# Patient Record
Sex: Female | Born: 1985 | Race: White | Hispanic: No | Marital: Married | Smoking: Never smoker
Health system: Southern US, Community
[De-identification: ages and names within clinical notes are randomized; demographics above are authoritative.]

## PROBLEM LIST (undated history)

## (undated) DIAGNOSIS — F32A Depression, unspecified: Secondary | ICD-10-CM

## (undated) DIAGNOSIS — R011 Cardiac murmur, unspecified: Secondary | ICD-10-CM

## (undated) DIAGNOSIS — D649 Anemia, unspecified: Secondary | ICD-10-CM

## (undated) DIAGNOSIS — J45909 Unspecified asthma, uncomplicated: Secondary | ICD-10-CM

## (undated) DIAGNOSIS — J069 Acute upper respiratory infection, unspecified: Secondary | ICD-10-CM

## (undated) DIAGNOSIS — F419 Anxiety disorder, unspecified: Secondary | ICD-10-CM

## (undated) DIAGNOSIS — G4730 Sleep apnea, unspecified: Secondary | ICD-10-CM

## (undated) HISTORY — DX: Cardiac murmur, unspecified: R01.1

## (undated) HISTORY — DX: Anemia, unspecified: D64.9

## (undated) HISTORY — PX: COLONOSCOPY: SHX174

## (undated) HISTORY — DX: Anxiety disorder, unspecified: F41.9

## (undated) HISTORY — DX: Unspecified asthma, uncomplicated: J45.909

## (undated) HISTORY — DX: Depression, unspecified: F32.A

## (undated) HISTORY — DX: Acute upper respiratory infection, unspecified: J06.9

---

## 2020-07-31 ENCOUNTER — Other Ambulatory Visit (HOSPITAL_COMMUNITY): Payer: Self-pay | Admitting: Internal Medicine

## 2020-10-19 ENCOUNTER — Other Ambulatory Visit: Payer: Self-pay

## 2020-10-19 ENCOUNTER — Encounter: Payer: Self-pay | Admitting: Emergency Medicine

## 2020-10-19 ENCOUNTER — Ambulatory Visit
Admission: EM | Admit: 2020-10-19 | Discharge: 2020-10-19 | Disposition: A | Discharge status: Home / Self Care | Payer: 59 | Attending: Emergency Medicine | Admitting: Emergency Medicine

## 2020-10-19 DIAGNOSIS — R350 Frequency of micturition: Secondary | ICD-10-CM

## 2020-10-19 DIAGNOSIS — R300 Dysuria: Secondary | ICD-10-CM

## 2020-10-19 LAB — POCT URINALYSIS DIP (MANUAL ENTRY)
Bilirubin, UA: NEGATIVE
Glucose, UA: NEGATIVE mg/dL
Ketones, POC UA: NEGATIVE mg/dL
Nitrite, UA: NEGATIVE
Protein Ur, POC: NEGATIVE mg/dL
Spec Grav, UA: 1.01 (ref 1.010–1.025)
Urobilinogen, UA: 0.2 E.U./dL
pH, UA: 6.5 (ref 5.0–8.0)

## 2020-10-19 MED ORDER — PHENAZOPYRIDINE HCL 200 MG PO TABS: 200.0000 mg | ORAL_TABLET | Freq: Three times a day (TID) | ORAL | 0 refills | Status: DC

## 2020-10-19 MED ORDER — NITROFURANTOIN MONOHYD MACRO 100 MG PO CAPS: 100.0000 mg | ORAL_CAPSULE | Freq: Two times a day (BID) | ORAL | 0 refills | Status: DC

## 2020-10-19 NOTE — ED Provider Notes (Signed)
[facility]   CC: Burning with urination  SUBJECTIVE:  Cindy Hamilton is a 35 y.o. female who complains of urinary frequency, urgency and dysuria for the past 1 day.  Reports recent sexual activity.  Denies abdominal or back pain.  Has NOT tried OTC medications.  Symptoms are made worse with urination.  Denies similar symptoms in the past.  Denies fever, chills, nausea, vomiting, abdominal pain, flank pain, abnormal vaginal discharge or bleeding, hematuria.    LMP: Patient's last menstrual period was 09/21/2020.  ROS: As in HPI.  All other pertinent ROS negative.     History reviewed. No pertinent past medical history. History reviewed. No pertinent surgical history. No Known Allergies No current facility-administered medications on file prior to encounter.   Current Outpatient Medications on File Prior to Encounter  Medication Sig Dispense Refill  . ferrous sulfate 325 (65 FE) MG EC tablet Take 325 mg by mouth 3 (three) times daily with meals.    Marland Kitchen FLUoxetine (PROZAC) 20 MG tablet Take 20 mg by mouth daily.     Social History   Socioeconomic History  . Marital status: Married    Spouse name: Not on file  . Number of children: Not on file  . Years of education: Not on file  . Highest education level: Not on file  Occupational History  . Not on file  Tobacco Use  . Smoking status: Not on file  . Smokeless tobacco: Not on file  Substance and Sexual Activity  . Alcohol use: Not on file  . Drug use: Not on file  . Sexual activity: Not on file  Other Topics Concern  . Not on file  Social History Narrative  . Not on file   Social Determinants of Health   Financial Resource Strain: Not on file  Food Insecurity: Not on file  Transportation Needs: Not on file  Physical Activity: Not on file  Stress: Not on file  Social Connections: Not on file  Intimate Partner Violence: Not on file   History reviewed. No pertinent family history.  OBJECTIVE:  Vitals:    10/19/20 1256  BP: 118/69  Pulse: 81  Resp: 16  Temp: 98.5 F (36.9 C)  TempSrc: Oral  SpO2: 98%  Weight: 245 lb (111.1 kg)   General appearance: Alert in no acute distress HEENT: NCAT.  Oropharynx clear.  Lungs: clear to auscultation bilaterally without adventitious breath sounds Heart: regular rate and rhythm.   Abdomen: soft; non-distended; no tenderness; bowel sounds present; no guarding Back: no CVA tenderness Extremities: moves extremities without difficulty Skin: warm and dry Neurologic: Ambulates from chair to exam table without difficulty Psychological: alert and cooperative; normal mood and affect  Labs Reviewed  POCT URINALYSIS DIP (MANUAL ENTRY) - Abnormal; Notable for the following components:      Result Value   Blood, UA trace-intact (*)    Leukocytes, UA Trace (*)    All other components within normal limits    ASSESSMENT & PLAN:  1. Dysuria   2. Urinary frequency     Meds ordered this encounter  Medications  . nitrofurantoin, macrocrystal-monohydrate, (MACROBID) 100 MG capsule    Sig: Take 1 capsule (100 mg total) by mouth 2 (two) times daily.    Dispense:  10 capsule    Refill:  0    Order Specific Question:   Supervising Provider    Answer:   Eustace Moore [[id number]]  . phenazopyridine (PYRIDIUM) 200 MG tablet    Sig: Take 1  tablet (200 mg total) by mouth 3 (three) times daily.    Dispense:  6 tablet    Refill:  0    Order Specific Question:   Supervising Provider    Answer:   Eustace Moore [[id number]]   Urine concerning for UTI Urine culture sent.  We will call you with abnormal results.   Push fluids and get plenty of rest.   Take antibiotic as directed and to completion Take pyridium as prescribed and as needed for symptomatic relief Follow up with PCP if symptoms persists Return here or go to ER if you have any new or worsening symptoms such as fever, worsening abdominal pain, nausea/vomiting, flank pain, etc...  Outlined  signs and symptoms indicating need for more acute intervention. Patient verbalized understanding. After Visit Summary given.     Rennis Harding, PA-C 10/19/20 1335

## 2020-10-19 NOTE — Discharge Instructions (Signed)
Urine concerning for UTI Urine culture sent.  We will call you with abnormal results.   Push fluids and get plenty of rest.   Take antibiotic as directed and to completion Take pyridium as prescribed and as needed for symptomatic relief Follow up with PCP if symptoms persists Return here or go to ER if you have any new or worsening symptoms such as fever, worsening abdominal pain, nausea/vomiting, flank pain, etc... 

## 2020-10-19 NOTE — ED Triage Notes (Signed)
Pt started having UTI symptoms yesterday.  Urgency and frequency and burning.

## 2020-10-22 LAB — URINE CULTURE: Culture: 40000 — AB

## 2020-11-03 ENCOUNTER — Other Ambulatory Visit: Payer: Self-pay

## 2020-11-03 ENCOUNTER — Encounter: Payer: Self-pay | Admitting: Internal Medicine

## 2020-11-03 ENCOUNTER — Other Ambulatory Visit (HOSPITAL_COMMUNITY): Payer: Self-pay

## 2020-11-03 ENCOUNTER — Other Ambulatory Visit (HOSPITAL_COMMUNITY)
Admission: RE | Admit: 2020-11-03 | Discharge: 2020-11-03 | Disposition: A | Discharge status: Home / Self Care | Payer: 59 | Source: Ambulatory Visit | Attending: Internal Medicine | Admitting: Internal Medicine

## 2020-11-03 ENCOUNTER — Ambulatory Visit (HOSPITAL_COMMUNITY)
Admission: RE | Admit: 2020-11-03 | Discharge: 2020-11-03 | Disposition: A | Discharge status: Home / Self Care | Payer: 59 | Source: Ambulatory Visit | Attending: Internal Medicine | Admitting: Internal Medicine

## 2020-11-03 ENCOUNTER — Other Ambulatory Visit (HOSPITAL_BASED_OUTPATIENT_CLINIC_OR_DEPARTMENT_OTHER): Payer: Self-pay

## 2020-11-03 ENCOUNTER — Telehealth: Payer: 59 | Admitting: Internal Medicine

## 2020-11-03 VITALS — Ht 70.0 in | Wt 250.0 lb

## 2020-11-03 DIAGNOSIS — E669 Obesity, unspecified: Secondary | ICD-10-CM

## 2020-11-03 DIAGNOSIS — Z13220 Encounter for screening for lipoid disorders: Secondary | ICD-10-CM | POA: Diagnosis not present

## 2020-11-03 DIAGNOSIS — M542 Cervicalgia: Secondary | ICD-10-CM | POA: Diagnosis not present

## 2020-11-03 DIAGNOSIS — D649 Anemia, unspecified: Secondary | ICD-10-CM | POA: Insufficient documentation

## 2020-11-03 LAB — CBC WITH DIFFERENTIAL/PLATELET
Abs Immature Granulocytes: 0.02 10*3/uL (ref 0.00–0.07)
Basophils Absolute: 0 10*3/uL (ref 0.0–0.1)
Basophils Relative: 1 %
Eosinophils Absolute: 0.1 10*3/uL (ref 0.0–0.5)
Eosinophils Relative: 2 %
HCT: 38.5 % (ref 36.0–46.0)
Hemoglobin: 12.9 g/dL (ref 12.0–15.0)
Immature Granulocytes: 0 %
Lymphocytes Relative: 28 %
Lymphs Abs: 1.9 10*3/uL (ref 0.7–4.0)
MCH: 29.1 pg (ref 26.0–34.0)
MCHC: 33.5 g/dL (ref 30.0–36.0)
MCV: 86.9 fL (ref 80.0–100.0)
Monocytes Absolute: 0.4 10*3/uL (ref 0.1–1.0)
Monocytes Relative: 7 %
Neutro Abs: 4.2 10*3/uL (ref 1.7–7.7)
Neutrophils Relative %: 62 %
Platelets: 273 10*3/uL (ref 150–400)
RBC: 4.43 MIL/uL (ref 3.87–5.11)
RDW: 13 % (ref 11.5–15.5)
WBC: 6.6 10*3/uL (ref 4.0–10.5)
nRBC: 0 % (ref 0.0–0.2)

## 2020-11-03 LAB — TSH: TSH: 1.2 u[IU]/mL (ref 0.350–4.500)

## 2020-11-03 LAB — COMPREHENSIVE METABOLIC PANEL
ALT: 17 U/L (ref 0–44)
AST: 18 U/L (ref 15–41)
Albumin: 4.2 g/dL (ref 3.5–5.0)
Alkaline Phosphatase: 70 U/L (ref 38–126)
Anion gap: 10 (ref 5–15)
BUN: 11 mg/dL (ref 6–20)
CO2: 24 mmol/L (ref 22–32)
Calcium: 9.6 mg/dL (ref 8.9–10.3)
Chloride: 103 mmol/L (ref 98–111)
Creatinine, Ser: 0.6 mg/dL (ref 0.44–1.00)
GFR, Estimated: >60 mL/min (ref >60)
Glucose, Bld: 122 mg/dL — ABNORMAL HIGH (ref 70–99)
Potassium: 3.6 mmol/L (ref 3.5–5.1)
Sodium: 137 mmol/L (ref 135–145)
Total Bilirubin: 0.4 mg/dL (ref 0.3–1.2)
Total Protein: 7.4 g/dL (ref 6.5–8.1)

## 2020-11-03 LAB — FERRITIN: Ferritin: 61 ng/mL (ref 11–307)

## 2020-11-03 LAB — FOLATE: Folate: 8.8 ng/mL (ref >5.9)

## 2020-11-03 LAB — IRON AND TIBC
Iron: 43 ug/dL (ref 28–170)
Saturation Ratios: 12 % (ref 10.4–31.8)
TIBC: 371 ug/dL (ref 250–450)
UIBC: 328 ug/dL

## 2020-11-03 LAB — VITAMIN B12: Vitamin B-12: 546 pg/mL (ref 180–914)

## 2020-11-03 LAB — HEMOGLOBIN A1C
Hgb A1c MFr Bld: 5.4 % (ref 4.8–5.6)
Mean Plasma Glucose: 108.28 mg/dL

## 2020-11-03 MED ORDER — FERROUS SULFATE 325 (65 FE) MG PO TBEC
325.0000 mg | DELAYED_RELEASE_TABLET | Freq: Every day | ORAL | 2 refills | Status: AC
  Filled 2020-11-03 (×2): qty 90, 90d supply, fill #0

## 2020-11-03 MED ORDER — VALACYCLOVIR HCL 1 G PO TABS
1000.0000 mg | ORAL_TABLET | Freq: Two times a day (BID) | ORAL | 0 refills | Status: DC
  Filled 2020-11-03 (×2): qty 60, 30d supply, fill #0

## 2020-11-03 MED ORDER — DICLOFENAC SODIUM 1 % EX GEL
2.0000 g | Freq: Four times a day (QID) | CUTANEOUS | 1 refills | Status: DC
  Filled 2020-11-03: qty 100, 6d supply, fill #0
  Filled 2020-11-03: qty 100, 15d supply, fill #0

## 2020-11-03 MED ORDER — FLUOXETINE HCL 20 MG PO CAPS
ORAL_CAPSULE | Freq: Every day | ORAL | 6 refills | Status: DC
  Filled 2020-11-03 (×2): qty 30, 30d supply, fill #0
  Filled 2020-12-12: qty 30, 30d supply, fill #1
  Filled 2021-01-17: qty 30, 30d supply, fill #2
  Filled 2021-02-20: qty 30, 30d supply, fill #3
  Filled 2021-04-01: qty 30, 30d supply, fill #4
  Filled 2021-05-08: qty 30, 30d supply, fill #5
  Filled 2021-06-06: qty 30, 30d supply, fill #6

## 2020-11-03 NOTE — Progress Notes (Signed)
Ht 5\' 10"  (1.778 m)   Wt 250 lb (113.4 kg)   BMI 35.87 kg/m    Subjective:    Patient ID: Cindy Hamilton, female    DOB: [birth year], 35 y.o.   MRN: [id number]  HPI: Cindy Hamilton is a 35 y.o. female  Pt Is new to the practice.  Depression        This is a chronic (prozac 20 mg daily for 10 - 12 years ) problem.  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   Associated symptoms include no decreased concentration, no fatigue, no helplessness, no hopelessness, does not have insomnia, not irritable, no restlessness, no decreased interest, no appetite change and no body aches.( Sleep wnl Appetite wnl Wt - LOSt about 50 lbs and put every lbs back.) Back Pain Chronicity: has had some neck pain and back pain upper back pain sec to being heavy bresated. has had neck pain and releievd with rest and ice packs. some low back pain - weight gain isnt helping and pt has insight about this. Episode onset: no numbness no tingling feels more muscular feels she has some C spine deformity. Pertinent negatives include no abdominal pain, fever or paresthesias.  Anemia Presents for initial visit. There has been no abdominal pain, bruising/bleeding easily, confusion, fever, leg swelling, light-headedness, malaise/fatigue, pallor, palpitations or paresthesias. Signs of blood loss that are present include menorrhagia. Signs of blood loss that are not present include hematemesis.    Chief Complaint  Patient presents with  . New Patient (Initial Visit)    Patient would like to establish care, have some medication refills and is also having some back pain in left low back    Relevant past medical, surgical, family and social history reviewed and updated as indicated. Interim medical history since our last visit reviewed. Allergies and medications reviewed and  updated.  Review of Systems  Constitutional: Negative for appetite change, fatigue, fever and malaise/fatigue.  Cardiovascular: Negative for palpitations.  Gastrointestinal: Negative for abdominal pain and hematemesis.  Genitourinary: Positive for menorrhagia.  Musculoskeletal: Positive for back pain.  Skin: Negative for pallor.  Neurological: Negative for light-headedness and paresthesias.  Hematological: Does not bruise/bleed easily.  Psychiatric/Behavioral: Positive for depression. Negative for confusion and decreased concentration. The patient does not have insomnia.     Per HPI unless specifically indicated above     Objective:    Ht 5\' 10"  (1.778 m)   Wt 250 lb (113.4 kg)   BMI 35.87 kg/m   Wt Readings from Last 3 Encounters:  11/03/20 250 lb (113.4 kg)  10/19/20 245 lb (111.1 kg)    Physical Exam Vitals and nursing note reviewed.  Constitutional:      General: She is not irritable.She is not in acute distress.    Appearance: Normal appearance. She is not ill-appearing or diaphoretic.  HENT:     Head: Normocephalic and atraumatic.     Right Ear: Tympanic membrane and external ear normal. There is no impacted cerumen.     Left Ear: External ear normal.     Nose: No congestion or rhinorrhea.     Mouth/Throat:     Pharynx: No oropharyngeal exudate or posterior oropharyngeal erythema.  Eyes:     Conjunctiva/sclera: Conjunctivae normal.     Pupils: Pupils are equal, round, and reactive to light.  Cardiovascular:     Rate and Rhythm: Normal rate and regular rhythm.     Heart sounds: No murmur heard. No friction rub. No gallop.   Pulmonary:     Effort: No respiratory distress.     Breath sounds: No stridor. No wheezing or rhonchi.  Chest:     Chest wall: No tenderness.  Abdominal:     General: Abdomen is flat. Bowel sounds are normal. There is no distension.     Palpations: Abdomen is soft. There is no mass.     Tenderness: There is no abdominal tenderness. There  is no guarding.  Musculoskeletal:        General: No swelling or deformity.     Cervical back: Normal range of motion and neck supple. No rigidity or tenderness.     Right lower leg: No edema.     Left lower leg: No edema.  Skin:    General: Skin is warm and dry.     Coloration: Skin is not jaundiced.     Findings: No erythema.  Neurological:     Mental Status: She is alert and oriented to person, place, and time. Mental status is at baseline.  Psychiatric:        Mood and Affect: Mood normal.        Behavior: Behavior normal.        Thought Content: Thought content normal.        Judgment: Judgment normal.     Results for orders placed or performed during the hospital encounter of 10/19/20  Urine Culture   Specimen: Urine, Random  Result Value Ref Range   Specimen Description      URINE, RANDOM Performed at [facility], [address], [place], [place] [address]    Special Requests      NONE Performed at [facility], [address] [place], [place] [address]    Culture 40,000 COLONIES/mL ESCHERICHIA COLI (A)    Report Status 10/22/2020 FINAL    Organism ID, Bacteria ESCHERICHIA COLI (A)       Susceptibility   Escherichia coli - MIC*    AMPICILLIN 4 SENSITIVE Sensitive     CEFAZOLIN <=4 SENSITIVE Sensitive     CEFEPIME <=0.12 SENSITIVE Sensitive     CEFTRIAXONE <=0.25 SENSITIVE Sensitive     CIPROFLOXACIN <=0.25 SENSITIVE Sensitive     GENTAMICIN <=1 SENSITIVE Sensitive     IMIPENEM <=0.25 SENSITIVE Sensitive     NITROFURANTOIN <=16 SENSITIVE Sensitive     TRIMETH/SULFA <=20 SENSITIVE Sensitive     AMPICILLIN/SULBACTAM <=2 SENSITIVE Sensitive     PIP/TAZO <=4 SENSITIVE Sensitive     * 40,000 COLONIES/mL ESCHERICHIA COLI  POCT urinalysis dipstick  Result Value Ref Range   Color, UA yellow yellow   Clarity, UA clear clear   Glucose, UA negative negative mg/dL   Bilirubin, UA negative negative   Ketones, POC UA negative negative mg/dL   Spec Grav, UA 1.601  1.010 - 1.025   Blood, UA trace-intact (A) negative   pH, UA 6.5 5.0 - 8.0   Protein Ur, POC negative negative mg/dL   Urobilinogen, UA 0.2 0.2 or 1.0 E.U./dL   Nitrite, UA Negative Negative   Leukocytes, UA Trace (A) Negative        Current Outpatient Medications:  .  cholecalciferol (VITAMIN D3) 25 MCG (1000 UNIT) tablet, Take 1,000 Units by mouth daily., Disp: , Rfl:  .  ferrous sulfate 325 (65 FE) MG EC tablet, Take 325 mg  by mouth 3 (three) times daily with meals., Disp: , Rfl:  .  FLUoxetine (PROZAC) 20 MG capsule, TAKE 1 CAPSULE BY MOUTH ONCE A DAY, Disp: 30 capsule, Rfl: 11 .  valACYclovir (VALTREX) 1000 MG tablet, Take 1,000 mg by mouth 2 (two) times daily. 2 tablets every 12 hours as needed, Disp: , Rfl:     Assessment & Plan:  1. Anemia she a ho of menorrhagia probably secondary to such. check bw before next visit , iron studies, b12 , folate. pt deneis symptoms. adviced to contact./ call clinic if symptoms worsen.  2. Neck pain musculoskeletal will check x-ray however secondary to patient's concerns of heavy breasts.  She is concerned that she has having back and neck problems secondary to such. Is considering breast reduction surgery at some point future will need to follow-up with breast surgeon for such Neck pain likely secondary to this cause however  3.depression/dieting on Prozac 20 mg daily wants to continue such .  willl continue  Check thyroid panel  4.  Cold sore:  Pt is on Oral Valtrex 1000 mg bid x 1 day  for such prn.           Problem List Items Addressed This Visit   None      Follow up plan: No follow-ups on file.

## 2020-11-06 ENCOUNTER — Other Ambulatory Visit (HOSPITAL_COMMUNITY): Payer: Self-pay

## 2020-11-07 ENCOUNTER — Other Ambulatory Visit (HOSPITAL_COMMUNITY): Payer: Self-pay

## 2020-11-08 ENCOUNTER — Other Ambulatory Visit (HOSPITAL_COMMUNITY): Payer: Self-pay

## 2020-11-17 ENCOUNTER — Telehealth: Payer: 59 | Admitting: Internal Medicine

## 2020-12-12 ENCOUNTER — Other Ambulatory Visit (HOSPITAL_COMMUNITY): Payer: Self-pay

## 2021-01-17 ENCOUNTER — Other Ambulatory Visit (HOSPITAL_COMMUNITY): Payer: Self-pay

## 2021-02-15 ENCOUNTER — Other Ambulatory Visit: Payer: Self-pay

## 2021-02-15 ENCOUNTER — Ambulatory Visit (HOSPITAL_COMMUNITY)
Admission: RE | Admit: 2021-02-15 | Discharge: 2021-02-15 | Disposition: A | Discharge status: Home / Self Care | Payer: 59 | Source: Ambulatory Visit | Attending: Internal Medicine | Admitting: Internal Medicine

## 2021-02-15 ENCOUNTER — Ambulatory Visit: Payer: 59 | Admitting: Internal Medicine

## 2021-02-15 ENCOUNTER — Other Ambulatory Visit (HOSPITAL_COMMUNITY): Payer: Self-pay

## 2021-02-15 ENCOUNTER — Encounter: Payer: Self-pay | Admitting: Internal Medicine

## 2021-02-15 VITALS — BP 111/76 | HR 80 | Temp 99.4°F | Ht 69.49 in | Wt 256.0 lb

## 2021-02-15 DIAGNOSIS — M79672 Pain in left foot: Secondary | ICD-10-CM

## 2021-02-15 DIAGNOSIS — F988 Other specified behavioral and emotional disorders with onset usually occurring in childhood and adolescence: Secondary | ICD-10-CM

## 2021-02-15 DIAGNOSIS — M7732 Calcaneal spur, left foot: Secondary | ICD-10-CM | POA: Diagnosis not present

## 2021-02-15 MED ORDER — FAMOTIDINE 20 MG PO TABS
20.0000 mg | ORAL_TABLET | Freq: Two times a day (BID) | ORAL | 1 refills | Status: DC
  Filled 2021-02-15: qty 90, 45d supply, fill #0
  Filled 2021-05-23: qty 90, 45d supply, fill #1

## 2021-02-15 NOTE — Progress Notes (Signed)
BP 111/76   Pulse 80   Temp 99.4 F (37.4 C) (Oral)   Ht 5' 9.49" (1.765 m)   Wt 256 lb (116.1 kg)   SpO2 99%   BMI 37.28 kg/m    Subjective:    Patient ID: Cindy Hamilton, female    DOB: 1985-10-02, 35 y.o.   MRN: [id number]  Chief Complaint  Patient presents with   Anxiety   Depression   ADHD    Wants to talk about starting ADHD meds   Foot Pain    Left foot pain x 7 months, weight bearing comes and goes    HPI: Cindy Hamilton is a 35 y.o. female  Pt feels she has undiagnosed ADD had an eating disorder and saw psych   Anxiety    Foot Pain   Chief Complaint  Patient presents with   Anxiety   Depression   ADHD    Wants to talk about starting ADHD meds   Foot Pain    Left foot pain x 7 months, weight bearing comes and goes    Relevant past medical, surgical, family and social history reviewed and updated as indicated. Interim medical history since our last visit reviewed. Allergies and medications reviewed and updated.  Review of Systems  Per HPI unless specifically indicated above     Objective:    BP 111/76   Pulse 80   Temp 99.4 F (37.4 C) (Oral)   Ht 5' 9.49" (1.765 m)   Wt 256 lb (116.1 kg)   SpO2 99%   BMI 37.28 kg/m   Wt Readings from Last 3 Encounters:  02/15/21 256 lb (116.1 kg)  11/03/20 250 lb (113.4 kg)  10/19/20 245 lb (111.1 kg)    Physical Exam Vitals and nursing note reviewed.  Constitutional:      General: She is not in acute distress.    Appearance: Normal appearance. She is not ill-appearing or diaphoretic.  HENT:     Head: Normocephalic and atraumatic.     Right Ear: Tympanic membrane and external ear normal. There is no impacted cerumen.     Left Ear: External ear normal.     Nose: No congestion or rhinorrhea.     Mouth/Throat:     Pharynx: No oropharyngeal exudate or posterior oropharyngeal erythema.  Eyes:     Conjunctiva/sclera: Conjunctivae normal.     Pupils: Pupils are equal, round, and reactive  to light.  Cardiovascular:     Rate and Rhythm: Normal rate and regular rhythm.     Heart sounds: No murmur heard.   No friction rub. No gallop.  Pulmonary:     Effort: No respiratory distress.     Breath sounds: No stridor. No wheezing or rhonchi.  Chest:     Chest wall: No tenderness.  Abdominal:     General: Abdomen is flat. Bowel sounds are normal. There is no distension.     Palpations: Abdomen is soft. There is no mass.     Tenderness: There is no abdominal tenderness. There is no guarding.  Musculoskeletal:        General: No swelling or deformity.     Cervical back: Normal range of motion and neck supple. No rigidity or tenderness.     Right lower leg: No edema.     Left lower leg: No edema.  Skin:    General: Skin is warm and dry.     Coloration: Skin is not jaundiced.     Findings: No erythema.  Neurological:  Mental Status: She is alert and oriented to person, place, and time. Mental status is at baseline.  Psychiatric:        Mood and Affect: Mood normal.        Behavior: Behavior normal.        Thought Content: Thought content normal.        Judgment: Judgment normal.    Results for orders placed or performed during the hospital encounter of 11/03/20  Vitamin B12  Result Value Ref Range   Vitamin B-12 546 180 - 914 pg/mL  TSH  Result Value Ref Range   TSH 1.200 0.350 - 4.500 uIU/mL  Iron and TIBC  Result Value Ref Range   Iron 43 28 - 170 ug/dL   TIBC 950 932 - 671 ug/dL   Saturation Ratios 12 10.4 - 31.8 %   UIBC 328 ug/dL  HgB I4P  Result Value Ref Range   Hgb A1c MFr Bld 5.4 4.8 - 5.6 %   Mean Plasma Glucose 108.28 mg/dL  Folate  Result Value Ref Range   Folate 8.8 >5.9 ng/mL  Ferritin  Result Value Ref Range   Ferritin 61 11 - 307 ng/mL  Comprehensive metabolic panel  Result Value Ref Range   Sodium 137 135 - 145 mmol/L   Potassium 3.6 3.5 - 5.1 mmol/L   Chloride 103 98 - 111 mmol/L   CO2 24 22 - 32 mmol/L   Glucose, Bld 122 (H) 70 - 99  mg/dL   BUN 11 6 - 20 mg/dL   Creatinine, Ser 8.09 0.44 - 1.00 mg/dL   Calcium 9.6 8.9 - 98.3 mg/dL   Total Protein 7.4 6.5 - 8.1 g/dL   Albumin 4.2 3.5 - 5.0 g/dL   AST 18 15 - 41 U/L   ALT 17 0 - 44 U/L   Alkaline Phosphatase 70 38 - 126 U/L   Total Bilirubin 0.4 0.3 - 1.2 mg/dL   GFR, Estimated >38 >25 mL/min   Anion gap 10 5 - 15  CBC with Differential/Platelet  Result Value Ref Range   WBC 6.6 4.0 - 10.5 K/uL   RBC 4.43 3.87 - 5.11 MIL/uL   Hemoglobin 12.9 12.0 - 15.0 g/dL   HCT 05.3 97.6 - 73.4 %   MCV 86.9 80.0 - 100.0 fL   MCH 29.1 26.0 - 34.0 pg   MCHC 33.5 30.0 - 36.0 g/dL   RDW 19.3 79.0 - 24.0 %   Platelets 273 150 - 400 K/uL   nRBC 0.0 0.0 - 0.2 %   Neutrophils Relative % 62 %   Neutro Abs 4.2 1.7 - 7.7 K/uL   Lymphocytes Relative 28 %   Lymphs Abs 1.9 0.7 - 4.0 K/uL   Monocytes Relative 7 %   Monocytes Absolute 0.4 0.1 - 1.0 K/uL   Eosinophils Relative 2 %   Eosinophils Absolute 0.1 0.0 - 0.5 K/uL   Basophils Relative 1 %   Basophils Absolute 0.0 0.0 - 0.1 K/uL   Immature Granulocytes 0 %   Abs Immature Granulocytes 0.02 0.00 - 0.07 K/uL        Current Outpatient Medications:    cholecalciferol (VITAMIN D3) 25 MCG (1000 UNIT) tablet, Take 1,000 Units by mouth daily., Disp: , Rfl:    diclofenac Sodium (VOLTAREN) 1 % GEL, Apply 2 grams topically 4 (four) times daily., Disp: 50 g, Rfl: 1   ferrous sulfate 325 (65 FE) MG EC tablet, Take 1 tablet (325 mg total) by mouth daily., Disp: 90  tablet, Rfl: 2   FLUoxetine (PROZAC) 20 MG capsule, TAKE 1 CAPSULE BY MOUTH ONCE A DAY, Disp: 30 capsule, Rfl: 6   valACYclovir (VALTREX) 1000 MG tablet, Take 1 tablet by mouth twice daily, Disp: 60 tablet, Rfl: 0    Assessment & Plan:  Anxiety is on prozac for 15 yrs now.  potential Side effects dw pt. to call office if develops any SE. will fu with me in 1 month for such. pt verbalised understanding.   2. Anemia sec to menorrhagia is on iron replacement. check bw before  next visit , iron studies, b12 , folate wnl. pt deneis symptoms. adviced to contact./ call clinic if symptoms worsen.  3.  Undiagnosed ADD Will refer to psych.  4. PHYSICAL :  Physical Wnl will check CMP, FLP, CBC,TSH, PSA.  Problem List Items Addressed This Visit   None    No orders of the defined types were placed in this encounter.    No orders of the defined types were placed in this encounter.    Follow up plan: No follow-ups on file.

## 2021-02-15 NOTE — Progress Notes (Signed)
Please let pt know she has  1. Moderate plantar calcaneal spur. 2. Minimal dorsal midfoot spurring.  To take otc NSAIDS, salt soaks.   thnx

## 2021-02-21 ENCOUNTER — Other Ambulatory Visit (HOSPITAL_COMMUNITY): Payer: Self-pay

## 2021-03-02 ENCOUNTER — Encounter: Payer: Self-pay | Admitting: Internal Medicine

## 2021-04-01 ENCOUNTER — Encounter: Payer: Self-pay | Admitting: Internal Medicine

## 2021-04-03 ENCOUNTER — Other Ambulatory Visit: Payer: Self-pay

## 2021-04-03 ENCOUNTER — Other Ambulatory Visit (HOSPITAL_COMMUNITY): Payer: Self-pay

## 2021-04-03 MED ORDER — ALBUTEROL SULFATE HFA 108 (90 BASE) MCG/ACT IN AERS
2.0000 | INHALATION_SPRAY | Freq: Four times a day (QID) | RESPIRATORY_TRACT | 0 refills | Status: DC | PRN
Start: 2021-04-03 — End: 2022-05-18
  Filled 2021-04-03 – 2021-04-09 (×2): qty 8.5, 25d supply, fill #0

## 2021-04-03 NOTE — Telephone Encounter (Signed)
Refill request sent to PCP for approval ? ?

## 2021-04-09 ENCOUNTER — Other Ambulatory Visit: Payer: Self-pay

## 2021-04-09 ENCOUNTER — Other Ambulatory Visit (HOSPITAL_COMMUNITY): Payer: Self-pay

## 2021-05-08 ENCOUNTER — Other Ambulatory Visit (HOSPITAL_COMMUNITY): Payer: Self-pay

## 2021-05-23 ENCOUNTER — Other Ambulatory Visit (HOSPITAL_COMMUNITY): Payer: Self-pay

## 2021-06-06 ENCOUNTER — Other Ambulatory Visit (HOSPITAL_COMMUNITY): Payer: Self-pay

## 2021-07-13 ENCOUNTER — Other Ambulatory Visit (HOSPITAL_COMMUNITY): Payer: Self-pay

## 2021-07-13 ENCOUNTER — Other Ambulatory Visit: Payer: Self-pay | Admitting: Internal Medicine

## 2021-07-14 ENCOUNTER — Other Ambulatory Visit (HOSPITAL_COMMUNITY): Payer: Self-pay

## 2021-07-14 MED ORDER — FLUOXETINE HCL 20 MG PO CAPS
ORAL_CAPSULE | Freq: Every day | ORAL | 1 refills | Status: DC
  Filled 2021-07-14: qty 30, 30d supply, fill #0
  Filled 2021-08-24: qty 30, 30d supply, fill #1

## 2021-07-14 NOTE — Telephone Encounter (Signed)
Requested Prescriptions  Pending Prescriptions Disp Refills   FLUoxetine (PROZAC) 20 MG capsule 30 capsule 1    Sig: TAKE 1 CAPSULE BY MOUTH ONCE A DAY     Psychiatry:  Antidepressants - SSRI Passed - 07/13/2021  4:34 PM      Passed - Valid encounter within last 6 months    Recent Outpatient Visits          4 months ago Attention deficit disorder, unspecified hyperactivity presence   [facility] Vigg, Avanti, MD   8 months ago Anemia, unspecified type   [facility] Vigg, Avanti, MD      Future Appointments            In 3 weeks Rodenbough, Aram Candela, PsyD [facility], [facility]   In 1 month Vigg, Avanti, MD [facility]

## 2021-07-16 ENCOUNTER — Other Ambulatory Visit (HOSPITAL_COMMUNITY): Payer: Self-pay

## 2021-07-17 ENCOUNTER — Other Ambulatory Visit (HOSPITAL_COMMUNITY): Payer: Self-pay

## 2021-08-08 ENCOUNTER — Encounter: Payer: 59 | Attending: Psychology | Admitting: Psychology

## 2021-08-08 ENCOUNTER — Encounter: Payer: Self-pay | Admitting: Psychology

## 2021-08-08 ENCOUNTER — Other Ambulatory Visit: Payer: Self-pay

## 2021-08-08 DIAGNOSIS — R41840 Attention and concentration deficit: Secondary | ICD-10-CM | POA: Diagnosis not present

## 2021-08-08 DIAGNOSIS — F419 Anxiety disorder, unspecified: Secondary | ICD-10-CM | POA: Diagnosis not present

## 2021-08-08 NOTE — Progress Notes (Signed)
Neuropsychological Consultation   Patient:   Cindy Hamilton   DOB:   [birth year]  MR Number:  [id number]  Location:  [facility] [place], [place] [id number] [facility]  [place] [id number] Dept: [phone]           Date of Service:   08/08/2021  Start Time:   8 AM End Time:   10 AM  Today's visit was an in person visit that was conducted in my outpatient clinic office.  The patient, her husband and myself were present for this clinical interview.  1 hour and 15 minutes was spent in face-to-face clinical interview and the other 45 minutes was spent with records review, report writing and setting up testing protocols.  Provider/Observer:  Ilean Skill, Psy.D.       Clinical Neuropsychologist       Billing Code/Service: 96116/96121  Chief Complaint:    Cindy Hamilton is a 36 year old ambidextrous female with minimal past medical history that includes a history of anemia, asthma and heart murmur as well as obesity.  The patient was referred by her PCP  Charlynne Cousins, MD for neuropsychological evaluation to provide differential diagnoses regarding potential of residual adult attention deficit disorder in a setting of symptoms of anxiety/depression.  The patient is also dealt with anxiety and depressive symptomatology.  The patient has a long history of attentional issues going back to childhood.  The patient reports that while there were described issues by her mother regarding impulsivity/hyperkinesis she was always a very good student and so much of this was overlooked.  The patient continued to have difficulties with attentional issues, organization and completing task and did see a therapist in high school who suggested that she needed to have this assessed.  However, the patient always made straight A's in school and did not think she needed to do so.  She was able to complete her  college education and be admitted into PA school and was able to get through that with efforts.  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  Reason for Service:  [name] is a 36 year old ambidextrous female with minimal past medical history that includes a history of anemia, asthma and heart murmur as well as obesity.  The patient was referred by her PCP  Charlynne Cousins, MD for neuropsychological evaluation to provide differential diagnoses regarding potential of residual adult attention deficit disorder in a setting of symptoms of anxiety/depression.  The patient is also dealt with anxiety and depressive symptomatology.  The patient has a long history of attentional issues going back to childhood.  The patient reports that while there were described issues by her mother regarding impulsivity/hyperkinesis she was always a very good student and so much of this was overlooked.  The patient continued to have difficulties with attentional issues, organization and completing task and did see a therapist in high school who suggested that she needed to have this assessed.  However, the patient always made straight A's in school and did not think she needed to do so.  She was able to complete her college education and be admitted into PA school and was able to get through that with efforts.  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  The patient reports that in elementary school she was [employer] because she was always "bouncing around."  The patient reports that throughout her life that completing task always took longer for her to complete and other people.  The patient describes a lifelong history of being easily distracted  and often zoning out at times.  The patient reports that she will easily lose her train of thought and describes her internal thinking as if there is a "bouncing ball inside my skull."  The patient describes her self as being impulsive and spends as much time trying to organize tasks as she does doing the task.  She describes her self as being very forgetful and will often jump from task to task and has a struggle just sitting down and staying on task.  Current medications include fluoxetine to help with her sense of feeling overwhelmed that she attributes to having difficulty completing her work in a timely manner.  The patient reports that if she is scheduled to work for 8 to 10 hours that it often takes her 12 hours or more to complete her task.  The patient reports that she does experience a lot of anxiety when she gets behind and starts to feel overwhelmed with everything.  She reports that sometimes this causes her to just shut down.  She reports that she would shut down like this with feeling overwhelmed even as far back as in high school.  She reports that she does not feel like this anxiety is really a generalized anxiety type of condition but just feeling overwhelmed by being in a constant state of not completing needed tasks.  The patient denies any significant prior concussive history and describes only 1 concussive event that happened in high school when she slid down some stairs and hit her head on a wall.  She reports an altered period of consciousness.  Patient reports that she was assessed in the emergency department and had a CT scan which was without evidence of acute process.  The patient reports that she quickly returned to baseline.  The patient reports that she does have significant issues with sleep.  She reports that she will get roughly 6 hours of sleep per night.  She reports that she falls asleep very quickly at night and "sleeps like a rock."  The patient reports that she has a hard  time getting up in the morning and is often very drowsy at work.  The patient admits to significant snoring and her husband describes a breathing/snoring pattern that would be consistent with episodes of occlusion in her breathing patterns.  The patient admits to long being concerned about the possibility of obstructive sleep apnea.  I have strongly encouraged the patient to follow-up with her PCP about getting sleep apnea assessed as if she does have obstructive sleep apnea that is significant it could be playing a significant deleterious role in her overall physical/medical and psychological functioning.  Behavioral Observation: Nekita Merwin  presents as a 36 y.o.-year-old Ambidextrous Caucasian Female who appeared her stated age. her dress was Appropriate and she was Well Groomed and her manners were Appropriate to the situation.  her participation was indicative of Appropriate behaviors.  There were not physical disabilities noted.  she displayed an appropriate level of cooperation and motivation.     Interactions:    Active Appropriate  Attention:   abnormal and attention span appeared shorter than  expected for age  Memory:   within normal limits; recent and remote memory intact  Visuo-spatial:  not examined  Speech (Volume):  normal  Speech:   normal; normal  Thought Process:  Coherent and Relevant  Though Content:  WNL; not suicidal and not homicidal  Orientation:   person, place, time/date, and situation  Judgment:   Good  Planning:   Good  Affect:    Anxious  Mood:    Anxious  Insight:   Good  Intelligence:   high  Marital Status/Living: The patient was born and raised in [place] and has 1 brother.  Her early childhood medical status and development was generally unremarkable.  The patient reports that she is always been rather clumsy.  The patient currently lives with her husband of 1 and half years and has no children.  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  Substance Use:  No concerns of substance abuse are reported.  Only very rare use of alcohol and no other substance use reported.  Education:   The patient completed her masters degree for her PA degree with a 4.0 GPA average.  The patient attended [employer] as well as [employer].  Her best subject was always science and did have trouble with some of the rote learning aspects of history classes.  She never repeated any grades.  Extracurricular activities including participating in theater.  The patient was valedictorian of her high school class.  Medical History:   Past Medical History:  Diagnosis Date   Anemia    Anxiety    Asthma    Depression    Heart murmur         There are no problems to display for this patient.             Abuse/Trauma History: The patient does not describe any history of abuse or traumatic experiences.  Psychiatric History:  The patient reports that she always had difficulties with attention and was rather impulsive and hyperkinetic as a child but did very well in school graduating as valedictorian in her high school class.  However, there were always issues with the patient completing her task but her diligence would stay on task until they were completed even though it required great effort on her part and usually took her much longer than others.  The patient has symptoms of anxiety with feeling overwhelmed as well as depressive symptomatology and does currently take fluoxetine to help with these issues.  Family Med/Psych History:  Family History  Problem Relation Age of Onset   Osteopenia Father    Prostate cancer Father     Impression/DX:  Corine Medsker is a 36 year old ambidextrous female with minimal past medical history that includes a history of anemia,  asthma and heart murmur as well as obesity.  The patient was referred by her PCP  Charlynne Cousins, MD for neuropsychological evaluation to provide differential diagnoses regarding potential of residual adult attention deficit disorder in a setting of symptoms of anxiety/depression.  The patient is also dealt with anxiety and depressive symptomatology.  The patient has a long history of attentional issues going back to childhood.  The patient reports that while there were described issues by her mother regarding impulsivity/hyperkinesis she was always a very good student and so much of this was overlooked.  The patient  continued to have difficulties with attentional issues, organization and completing task and did see a therapist in high school who suggested that she needed to have this assessed.  However, the patient always made straight A's in school and did not think she needed to do so.  She was able to complete her college education and be admitted into PA school and was able to get through that with efforts.  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  Disposition/Plan:  We have set the patient up for formal neuropsychological assessment to provide an objective assessment of a wide range of attention concentration and executive functioning measures.  We will combine this information with available subjective reports and history as well as overall medical history to facilitate attempts at more accurate diagnostic considerations and provide hopefully helpful recommendations going forward.  I strongly encouraged the patient to follow-up with her PCP regarding the possibility of obstructive sleep apnea and the patient assured me that she will follow-up on that.  She acknowledged that it has been a concern of hers for some time.  The patient will return to our office to complete  the comprehensive attention battery as well as the CAB CPT measures and we will combine that as part of a formal neuropsychological evaluation.  Diagnosis:    Attention and concentration deficit  Anxiety         Electronically Signed   _______________________ Ilean Skill, Psy.D. Clinical Neuropsychologist

## 2021-08-20 ENCOUNTER — Telehealth: Payer: 59 | Admitting: Internal Medicine

## 2021-08-24 ENCOUNTER — Other Ambulatory Visit (HOSPITAL_COMMUNITY): Payer: Self-pay

## 2021-08-28 ENCOUNTER — Other Ambulatory Visit: Payer: Self-pay

## 2021-08-28 DIAGNOSIS — F419 Anxiety disorder, unspecified: Secondary | ICD-10-CM | POA: Diagnosis not present

## 2021-08-28 DIAGNOSIS — R41840 Attention and concentration deficit: Secondary | ICD-10-CM | POA: Diagnosis not present

## 2021-08-28 NOTE — Progress Notes (Signed)
° °  Behavioral Observations  The patient appeared well-groomed and appropriately dressed. Her manners were polite and appropriate to the situation. She demonstrated a positive attitude toward testing as well as good effort. The patient did not have any difficulty understanding testing instructions.    Neuropsychology Note  Cindy Hamilton completed 60 minutes of neuropsychological testing with technician, Marica Otter, BA, under the supervision of Arley Phenix, PsyD., Clinical Neuropsychologist. The patient did not appear overtly distressed by the testing session, per behavioral observation or via self-report to the technician. Rest breaks were offered.   Clinical Decision Making: In considering the patient's current level of functioning, level of presumed impairment, nature of symptoms, emotional and behavioral responses during clinical interview, level of literacy, and observed level of motivation/effort, a battery of tests was selected by Dr. Kieth Brightly during initial consultation on 08/08/2021. This was communicated to the technician. Communication between the neuropsychologist and technician was ongoing throughout the testing session and changes were made as deemed necessary based on patient performance on testing, technician observations and additional pertinent factors such as those listed above.  Tests Administered: Comprehensive Attention Battery (CAB) Continuous Performance Test (CPT)   Results: Will be included in final report   Feedback to Patient: Cindy Hamilton will return on 11/15/2021 for an interactive feedback session with Dr. Kieth Brightly at which time her test performances, clinical impressions and treatment recommendations will be reviewed in detail. The patient understands she can contact our office should she require our assistance before this time.  60 minutes spent face-to-face with patient administering standardized tests, 30 minutes spent scoring Radiographer, therapeutic).  [CPT [id number], [id number]]  Full report to follow.

## 2021-09-04 ENCOUNTER — Other Ambulatory Visit: Payer: Self-pay | Admitting: Internal Medicine

## 2021-09-04 MED ORDER — FAMOTIDINE 20 MG PO TABS
20.0000 mg | ORAL_TABLET | Freq: Two times a day (BID) | ORAL | 0 refills | Status: DC
  Filled 2021-09-04: qty 90, 45d supply, fill #0

## 2021-09-04 NOTE — Telephone Encounter (Signed)
Requested Prescriptions  Pending Prescriptions Disp Refills   famotidine (PEPCID) 20 MG tablet 90 tablet 0    Sig: Take 1 tablet (20 mg total) by mouth 2 (two) times daily.     Gastroenterology:  H2 Antagonists Passed - 09/04/2021  3:06 PM      Passed - Valid encounter within last 12 months    Recent Outpatient Visits          6 months ago Attention deficit disorder, unspecified hyperactivity presence   [facility] Vigg, Avanti, MD   10 months ago Anemia, unspecified type   [facility] Vigg, Avanti, MD      Future Appointments            In 4 weeks Vigg, Avanti, MD [facility]

## 2021-09-05 ENCOUNTER — Other Ambulatory Visit (HOSPITAL_COMMUNITY): Payer: Self-pay

## 2021-09-27 ENCOUNTER — Encounter: Payer: Self-pay | Admitting: Psychology

## 2021-09-27 ENCOUNTER — Encounter: Payer: 59 | Attending: Psychology | Admitting: Psychology

## 2021-09-27 ENCOUNTER — Other Ambulatory Visit: Payer: Self-pay

## 2021-09-27 DIAGNOSIS — F5105 Insomnia due to other mental disorder: Secondary | ICD-10-CM | POA: Diagnosis not present

## 2021-09-27 DIAGNOSIS — F418 Other specified anxiety disorders: Secondary | ICD-10-CM | POA: Diagnosis not present

## 2021-09-27 DIAGNOSIS — F908 Attention-deficit hyperactivity disorder, other type: Secondary | ICD-10-CM | POA: Diagnosis not present

## 2021-09-27 NOTE — Progress Notes (Signed)
Neuropsychological Evaluation   Patient:  Cindy Hamilton   DOB: Apr 22, 1986  MR Number: [id number]  Location: [facility] [place] [facility] [address] [place], [address] [id number] [facility] [place] [address] Dept: [phone]  Start: 4 PM End: 5 PM  Provider/Observer:     Hershal Coria PsyD  Chief Complaint:      Chief Complaint  Patient presents with   Other    Attentional deficits with impulsivity and distractibility   Anxiety   Fatigue     Reason for Service:              Cindy Hamilton is a 36 year old ambidextrous female with minimal past medical history that includes a history of anemia, asthma and heart murmur as well as obesity.  The patient was referred by her PCP  Cindy Pardon, MD for neuropsychological evaluation to provide differential diagnoses regarding potential of residual adult attention deficit disorder in a setting of symptoms of anxiety/depression.  The patient has also dealt with anxiety and depressive symptomatology.  The patient has a long history of attentional issues going back to childhood.  The patient reports that while there were described issues by her mother regarding impulsivity/hyperkinesis she was always a very good student and so much of this was overlooked.  The patient continued to have difficulties with attentional issues, organization and completing task and did see a therapist in high school who suggested that she needed to have this assessed.  However, the patient always made straight A's in school and did not think she needed to do so.  She was able to complete her college education and be admitted into PA school and was able to get through that with significant efforts and diligence on her part.  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  The patient reports that in elementary school she was [employer] because she was always "bouncing around."  The patient reports that throughout her life that completing task always took longer for her to complete and other people.  The patient describes a lifelong history of being easily distracted and often zoning out at times.  The patient reports that she will easily lose her train of thought and describes her internal thinking as if there is a "bouncing ball inside my skull."  The patient describes her self as being impulsive and spends as much time trying to organize tasks as she does doing the task.  She describes her self as being very forgetful and will often jump from task to task and has a struggle just sitting down and staying on task.  Current medications include fluoxetine to help with her sense of feeling overwhelmed that she attributes to having difficulty completing her work in a timely manner.  The patient reports that if she is scheduled to work for 8 to 10 hours that it often takes her 12 hours or more to complete her task.  The patient reports that she does experience a lot of anxiety when she gets behind and starts to feel overwhelmed with everything.  She reports that sometimes this causes her to just shut down.  She reports that she would shut down like this with feeling overwhelmed even as far back as in high school.  She reports that she does not feel like this anxiety is  really a generalized anxiety type of condition but just feeling overwhelmed by being in a constant state of not completing needed tasks.  The patient denies any significant prior concussive history and describes only 1 concussive event that happened in high school when she slid down some stairs and hit her head on a wall.  She reports an altered period of consciousness.  Patient reports that she was assessed in the emergency department and had a CT scan which was without evidence of acute process.   The patient reports that she quickly returned to baseline.  The patient reports that she does have significant issues with sleep.  She reports that she will get roughly 6 hours of sleep per night.  She reports that she falls asleep very quickly at night and "sleeps like a rock."  The patient reports that she has a hard time getting up in the morning and is often very drowsy at work.  The patient admits to significant snoring and her husband describes a breathing/snoring pattern that would be consistent with episodes of occlusion in her breathing patterns.  The patient admits to long being concerned about the possibility of obstructive sleep apnea.  I have strongly encouraged the patient to follow-up with her PCP about getting sleep apnea assessed as if she does have obstructive sleep apnea that is significant it could be playing a significant deleterious role in her overall physical/medical and psychological functioning.  The patient reports that she always had difficulties with attention and was rather impulsive and hyperkinetic as a child but did very well in school graduating as valedictorian in her high school class.  However, there were always issues with the patient completing her task but her diligence would stay on task until they were completed even though it required great effort on her part and usually took her much longer than others.  The patient has symptoms of anxiety with feeling overwhelmed as well as depressive symptomatology and does currently take fluoxetine to help with these issues.  Tests Administered: Comprehensive Attention Battery (CAB) Continuous Performance Test (CPT)  Participation Level:   Active  Participation Quality:  Appropriate      Behavioral Observation:  The patient appeared well-groomed and appropriately dressed. Her manners were polite and appropriate to the situation. She demonstrated a positive attitude toward testing as well as good effort. The patient did not  have any difficulty understanding testing instructions.  Well Groomed, Alert, and Appropriate.   Test Results:   Estimates of premorbid intellectual and cognitive functioning are quite high.  The patient graduated with there is masters degree completing her physicians assistant's degree with a 4.0 GPA average.  The patient was valedictorian of her high school class and graduated with her undergraduate degree from [employer] as well as [employer].  The patient likely has a premorbid intellectual and cognitive ability to be conservatively estimated to be around 130 and in the superior range relative to a normative population.  The patient would have had to have some degree of adequate to good attentional capacity although she reports that she worked very hard and had to be extremely diligent to complete her task in her work.  The patient clearly comes in to this assessment with excellent intellectual and cognitive abilities.  Summary of Results:   The patient was administered the comprehensive attention battery as well as the CAB CPT measures to provide an objective assessment of multiple domains of attention and concentration and executive functioning.  The patient  appeared to process this assessment in a very diligent and straightforward manner neither attempting to exaggerate or minimize her current symptomatology.  This does appear to be a valid assessment of her current functioning in the areas assessed.  Initially, the patient was administered the pure auditory and visual reaction time measures.  On the pure visual reaction time measure the patient accurately responded to 50 of 50 targets with no errors of omission but 1 impulsive response.  Her average response time was within normal limits although slightly slow relative to a normative population and likely represented caution and concern around avoidance of making errors.  On the pure auditory reaction time measure she  correctly identified 47 of 50 targets with 3 errors of omission but no multiple responses.  Her average response time was also slow and 1 standard deviation slower than her age-matched comparison group.  This again highlights the likelihood that she was being rather cautious and spent cognitive energy avoiding making errors.  The patient was then administered the discriminate reaction time measures.  On the visual discriminate reaction time measure she correctly identified 35 of 35 targets with 0 errors of omission and 0 errors of commission.  This is an effective score.  Her average response time was now within normal limits suggesting increased comfort and confidence in her abilities to complete these measures.  On the auditory discriminate reaction time measure she correctly identified 34 of 35 targets with 1 error of commission and 1 error of omission.  This is also an efficient score.  Her average response time was 778 ms which is within normal limits.  On the mixed/shift discriminate reaction time measure she correctly identified 29 of 30 targets but had 3 errors of commission with only 1 error of omission.  This is a slightly impaired score and indicate some mild difficulties with shifting attention between varying target stimuli.  Her average response time was within normal limits.  On the auditory/visual scan reaction time measures her efficiency with regard to accuracy scores continued.  On the visual scan reaction time measure she correctly identified 38 of 40 targets with 2 errors of omission and no errors of commission.  Her average response time was 691 ms which is within normal limits.  On the auditory scan reaction time measure she correctly responded to 40 of 40 targets with no errors of commission and no errors of omission with an average response time of 1140 ms which is within normative expectations.  On the mixed scan reaction time measure she had a little bit more difficulty shifting  between changing targets and correctly identified 37 of 40 targets with 3 errors of omission.  Her average response time was within normal limits.  The comparison to the mixed scan to her excellent performance on the visual and auditory scan reaction time measures does highlight a previously identified issue with mild difficulties with shifting attention to changing environmental stimuli and environmental targets.  On the auditory/visual encoding measures the patient did very well relative to a normative population.  She was more than 2 standard deviations above her age and gender matched peer group for both the auditory forwards and auditory backwards encoding measures and 1 standard deviation above her peers for the visual backwards encoding measure.  She was in the average range with regard to visual encoding forwards measure.  This performance is quite good and suggest a very strong encoding capacity.  This is likely one of the primary variables and strengths for her  attention which has allowed her to be able to be so effective in academic learning even though she has to place special emphasis on completing the task and staying focused when she is able to stay on task her encoding capacity is quite good and a very significant strength.  On the Stroop interference cancellation test, which starts out with 4 9 interference trials and a focus execute task requiring information processing speed and visual scanning the patient did very well across the board.  She exceeded normative expectations on all 4 9 interference trials and shows excellent focus execute abilities and information processing speed.  On this measure once the 4 9 interference trials are completed the task immediately shifts into a targeted interference component where the patient continues with the same task demands but is presented with targeted auditory interference (Stroop effect).  While this typically has some initial impact on individuals  normative data suggest that a person quickly adapted to them and is able to achieve performance levels consistent with 9 interference trials after very brief period of time.  However, on this measure the patient had significant difficulty with the first interference trial that persisted throughout the measure and continued into the second interference trial.  While her performance even with these significant impacts of this targeted interference was at the upper end of the average range relative to a normative population it was significantly below what we would predict based on her extremely efficient and excellent not interference performance on these measures.  With time she was able to begin to approach her 9 interference performance by the last 2 interference trials.  Finally, the patient was administered the visual monitor CPT test of the comprehensive attention battery.  This is a 15-minute visual discriminate reaction time measure that is broken down into five 3-minute blocks of time for analysis.  This allows for assessment of any changes in performances at a very sensitive level over time.  On the first 3 minutes of this task the patient successfully and accurately responded to 30 of 30 targets with no errors of omission and no errors of commission.  Her average response time was 435 ms which is well within normative expectations.  However, by the 3 to 6-minute epic of time she began showing deterioration in her performance.  She had 2 errors of omission with slowed average response times already developing.  This pattern continued and she had between 3 and 5 errors of omission on various 3-minute blocks of time as well as beginning to have errors of commission as well.  Her average response time topped out at 58 9 ms which is around 150 ms slower than her initial performance on the first 3-minute block of time.  Typically, we would expect less than 100 ms slowing across the entire 15 minutes of this task  and the patient showed this developing between the 6 and 9-minute mark.  She showed a progressively increasing amounts of errors of omission and errors of commission as well as significant slowing and information processing speed in response times as early as the 3 to 9-minute mark time.  This pattern suggest significant issues with sustaining attention and increasing errors of omission and impulsive responses as a function of time.  Impression/Diagnosis:   Overall, the results of the current neuropsychological evaluation taking into account the patient's past medical history and subjective reports of attentional issues and hyperkinesis going back to childhood as well as objective assessment of a wide range of attention and  concentration variables do show considerable consistencies.  The patient shows patterns consistent with adult residual attention deficit disorder and patterns that display attentional deficits that are not those typically seen as primarily caused by anxiety or depressive symptomatology.  In fact, some of the attentional variables most sensitive to depression and anxiety (auditory encoding capacity) are her best areas of performance on these measures and she performed in the very superior range relative to a normative population on encoding capacity variables.  The patient shows patterns consistent with difficulty shifting between environmental stimuli and attentional targets, difficulty remaining free from external distractors.  While these were not severe relative to a normative population the patient is an extremely smart individual who is intellectual capacity and resulting educational and occupational demands would place a heavy burden on these attentional components.  This would result in clear difficulties in such high demanding situations.  Of particular note and the variable that is most consistent with attention deficit disorder is being one of the etiological factors had to do with  significant impairments for sustaining attention.  Typically individuals are able to sustain rather efficient and consistent performance across 22 minutes of time.  The patient was administered a 15-minute continuous performance measure and she began showing very early deterioration in cognitive functioning including information processing speed, increased lapses of attention as well as some degree of difficulties inhibiting impulsive responding.  The first sign of this deterioration occurred after just 3 to 6 minutes of time and showed significant worsening until she had slowed her average response times by more than 150 ms and was having between 3 and 5 errors of omission and 1-2 errors of commission during 3 minutes timeframes.  While the objective assessment and subjective symptoms and descriptions of attentional issues and complications going back to elementary school are consistent with a diagnosis of adult residual attention deficit disorder there are clearly some elements that are playing a deleterious role and need to be addressed.  The patient describes anxiety and depressive symptomatology and feeling overwhelmed at times.  While there are anxiety and attentional variables present I do think that the causal direction of these issues are likely attentional weaknesses and difficulties managing and coping with her very demanding occupational expectations triggering anxiety and frustration and feelings of being overwhelmed.  The patient is having to place a great deal of effort staying on task and maintaining her attention in an occupational setting where attention and particularly sustained attention are paramount.  The patient has all of the intellectual and cognitive abilities to do these tasks.  While anxiety and depression can cause attentional deficits I do think that her attentional deficits are the trigger for her anxiety and depression rather than vice versa.  Another variable that needs to be  directly addressed is a very real possibility that the patient has obstructive sleep apnea and while this may not be causative in nature for all of her difficulties, it is at least playing a disruptive role in her attentional capacity if present.  If the patient does have obstructive sleep apnea, effectively treating it would likely have one of the most helpful and beneficial interventions available to the patient.  Even without this in consideration, the patient is chronically sleep deprived and has difficulty falling asleep and regularly gets only around 6 hours of sleep each night.  Diligent work on sleep hygiene's and extending her sleep duration will also be an important intervention going forward.  As far as potential psychotropic interventions the patient  does continue to take fluoxetine and feels like it is helpful for her overall.  Given the results of the objective neuropsychological assessment she does also look like a good candidate for a trial of psychostimulant medications.  I think the most conservative approach would be to start her out with a trial of generic methylphenidate, which tends to have a relatively short clinical duration of 2-1/2 to 3 hours.  This would allow her to assess whether or not she was having any type of negative impact on anxiety.  The patient is extremely intelligent and would be able to manage medications and use multiple self directed doses during the day allowing her to utilize it twice per day and even potentially 3 times in a single day if warranted.  She should be cautioned that using it in the late afternoon or early evening could have a significant negative impact on her sleep pattern which is already impaired.  I suspect that the patient will experience some improvement with psychostimulant medications but I will leave any specific recommendations up to her treating physician.   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 Clinical Neuropsychologist

## 2021-10-02 ENCOUNTER — Telehealth: Payer: 59 | Admitting: Internal Medicine

## 2021-10-02 ENCOUNTER — Encounter: Payer: Self-pay | Admitting: Internal Medicine

## 2021-10-02 DIAGNOSIS — G4730 Sleep apnea, unspecified: Secondary | ICD-10-CM | POA: Diagnosis not present

## 2021-10-02 DIAGNOSIS — F339 Major depressive disorder, recurrent, unspecified: Secondary | ICD-10-CM

## 2021-10-02 NOTE — Progress Notes (Addendum)
? ?LMP 09/29/2021   ? ?Subjective:  ? ? Patient ID: Cindy Hamilton, female    DOB: 30-Aug-1985, 36 y.o.   MRN: [id number] ? ? ?This visit was completed via video visit through MyChart due to the restrictions of the COVID-19 pandemic. All issues as above were discussed and addressed. Physical exam was done as above through visual confirmation on video through MyChart. If it was felt that the patient should be evaluated in the office, they were directed there. The patient verbally consented to this visit. ?Location of the patient: work ?Location of the provider: work ?Those involved with this call:  ?Provider: Loura Pardon, MD ?CMA: Rolley Sims, CMA ?Front Desk/Registration: [employer]  ?Time spent on call:  10 minutes with patient face to face via video conference. More than 50% of this time was spent in counseling and coordination of care. 10 minutes total spent in review of patient's record and preparation of their chart. ? ? ?Chief Complaint  ?Patient presents with  ? Anxiety  ? ADHD  ? ? ?HPI: ?Cindy Hamilton is a 36 y.o. female ? ? ?This visit was completed via video visit through MyChart due to the restrictions of the COVID-19 pandemic. All issues as above were discussed and addressed. Physical exam was done as above through visual confirmation on video through MyChart. If it was felt that the patient should be evaluated in the office, they were directed there. The patient verbally consented to this visit. ?Location of the patient: work ?Location of the provider: work ?Those involved with this call:  ?Provider: Loura Pardon, MD ?CMA: Tristan Schroeder, CMA ?Front Desk/Registration: [employer]  ?Time spent on call: 10 minutes on the phone discussing health concerns. 10 minutes total spent in review of patient's record and preparation of their chart. ? ? ? ? 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 also had  Depression with anxiety ?Has had a problem to keep up with work she is struggling. To fu with him in April for such  ? ?Anxiety ?Presents for follow-up (stable is on prozac 20 mg daiy for such able to tolerate without SE) visit. Symptoms include decreased concentration and insomnia. Patient reports no chest pain, compulsions, confusion, depressed mood, dizziness, dry mouth, excessive worry, feeling of choking, hyperventilation, impotence, irritability, malaise, muscle tension, nausea, nervous/anxious behavior, obsessions, palpitations, panic, restlessness, shortness of breath or suicidal ideas.  ? ? ?Insomnia ?Primary symptoms: no fragmented sleep, no sleep disturbance, no difficulty falling asleep, no somnolence, no frequent awakening, no premature morning awakening.   ? ? ?Chief Complaint  ?Patient presents with  ? Anxiety  ? ADHD  ? ? ?Relevant past medical, surgical, family and social history reviewed and updated as indicated. Interim medical history since our last visit reviewed. ?Allergies and medications reviewed and updated. ? ?Review of Systems  ?Constitutional:  Negative for irritability.  ?Respiratory:  Negative for shortness of breath.   ?Cardiovascular:  Negative for chest pain and palpitations.  ?Gastrointestinal:  Negative for nausea.  ?Genitourinary:  Negative for impotence.  ?Neurological:  Negative for dizziness.  ?Psychiatric/Behavioral:  Positive for decreased concentration. Negative for confusion, sleep disturbance and suicidal ideas. The patient has insomnia. The patient is not nervous/anxious.   ? ?Per HPI unless specifically indicated above ? ?   ?Objective:  ?  ?LMP 09/29/2021   ?Wt Readings from Last 3 Encounters:  ?02/15/21 256 lb (116.1 kg)  ?11/03/20 250 lb (113.4 kg)  ?  10/19/20 245 lb (111.1 kg)  ?  ?Physical Exam ? ?Results for orders placed or performed during the hospital encounter of 11/03/20  ?Vitamin B12  ?Result Value Ref Range  ? Vitamin B-12 546 180 - 914 pg/mL  ?TSH  ?Result  Value Ref Range  ? TSH 1.200 0.350 - 4.500 uIU/mL  ?Iron and TIBC  ?Result Value Ref Range  ? Iron 43 28 - 170 ug/dL  ? TIBC 371 250 - 450 ug/dL  ? Saturation Ratios 12 10.4 - 31.8 %  ? UIBC 328 ug/dL  ?HgB A1c  ?Result Value Ref Range  ? Hgb A1c MFr Bld 5.4 4.8 - 5.6 %  ? Mean Plasma Glucose 108.28 mg/dL  ?Folate  ?Result Value Ref Range  ? Folate 8.8 >5.9 ng/mL  ?Ferritin  ?Result Value Ref Range  ? Ferritin 61 11 - 307 ng/mL  ?Comprehensive metabolic panel  ?Result Value Ref Range  ? Sodium 137 135 - 145 mmol/L  ? Potassium 3.6 3.5 - 5.1 mmol/L  ? Chloride 103 98 - 111 mmol/L  ? CO2 24 22 - 32 mmol/L  ? Glucose, Bld 122 (H) 70 - 99 mg/dL  ? BUN 11 6 - 20 mg/dL  ? Creatinine, Ser 0.60 0.44 - 1.00 mg/dL  ? Calcium 9.6 8.9 - 10.3 mg/dL  ? Total Protein 7.4 6.5 - 8.1 g/dL  ? Albumin 4.2 3.5 - 5.0 g/dL  ? AST 18 15 - 41 U/L  ? ALT 17 0 - 44 U/L  ? Alkaline Phosphatase 70 38 - 126 U/L  ? Total Bilirubin 0.4 0.3 - 1.2 mg/dL  ? GFR, Estimated >60 >60 mL/min  ? Anion gap 10 5 - 15  ?CBC with Differential/Platelet  ?Result Value Ref Range  ? WBC 6.6 4.0 - 10.5 K/uL  ? RBC 4.43 3.87 - 5.11 MIL/uL  ? Hemoglobin 12.9 12.0 - 15.0 g/dL  ? HCT 38.5 36.0 - 46.0 %  ? MCV 86.9 80.0 - 100.0 fL  ? MCH 29.1 26.0 - 34.0 pg  ? MCHC 33.5 30.0 - 36.0 g/dL  ? RDW 13.0 11.5 - 15.5 %  ? Platelets 273 150 - 400 K/uL  ? nRBC 0.0 0.0 - 0.2 %  ? Neutrophils Relative % 62 %  ? Neutro Abs 4.2 1.7 - 7.7 K/uL  ? Lymphocytes Relative 28 %  ? Lymphs Abs 1.9 0.7 - 4.0 K/uL  ? Monocytes Relative 7 %  ? Monocytes Absolute 0.4 0.1 - 1.0 K/uL  ? Eosinophils Relative 2 %  ? Eosinophils Absolute 0.1 0.0 - 0.5 K/uL  ? Basophils Relative 1 %  ? Basophils Absolute 0.0 0.0 - 0.1 K/uL  ? Immature Granulocytes 0 %  ? Abs Immature Granulocytes 0.02 0.00 - 0.07 K/uL  ? ?   ? ? ?Current Outpatient Medications:  ?  albuterol (VENTOLIN HFA) 108 (90 Base) MCG/ACT inhaler, Inhale 2 puffs into the lungs every 6 (six) hours as needed for wheezing or shortness of breath.,  Disp: 8.5 g, Rfl: 0 ?  cholecalciferol (VITAMIN D3) 25 MCG (1000 UNIT) tablet, Take 1,000 Units by mouth daily., Disp: , Rfl:  ?  diclofenac Sodium (VOLTAREN) 1 % GEL, Apply 2 grams topically 4 (four) times daily., Disp: 50 g, Rfl: 1 ?  famotidine (PEPCID) 20 MG tablet, Take 1 tablet (20 mg total) by mouth 2 (two) times daily., Disp: 90 tablet, Rfl: 0 ?  ferrous sulfate 325 (65 FE) MG EC tablet, Take 1 tablet (325 mg total) by mouth  daily., Disp: 90 tablet, Rfl: 2 ?  FLUoxetine (PROZAC) 20 MG capsule, TAKE 1 CAPSULE BY MOUTH ONCE A DAY, Disp: 30 capsule, Rfl: 1 ?  valACYclovir (VALTREX) 1000 MG tablet, Take 1 tablet by mouth twice daily, Disp: 60 tablet, Rfl: 0  ? ? ?Assessment & Plan:  ? ADD will need to fu with psychologist for further eval / results of Psychological testing  ?Per notes  has residual ADD  ?Unsure if needs meds  ?Will need to fu with psych if this happens pt verbalized understanding of such ? ?Anemia ho such cbc hh stable, iron studies done last visit all wnl ? ?Depression / anxiety stable on prozac continue current dose doesn't want to increase such  ?Stable, chronic fu in 6 months  ? ?Sleep apnea ?Needs eval and tx will refer to sleep med ? ? ?Problem List Items Addressed This Visit   ? ?  ? Respiratory  ? Sleep apnea - Primary  ? Relevant Orders  ? Ambulatory referral to Sleep Studies  ? CBC with Differential/Platelet  ? Comprehensive metabolic panel  ? Lipid panel  ? Urinalysis, Routine w reflex microscopic  ? TSH  ?  ? Other  ? Depression, recurrent (HCC)  ? Relevant Orders  ? CBC with Differential/Platelet  ? Comprehensive metabolic panel  ? Lipid panel  ? Urinalysis, Routine w reflex microscopic  ? TSH  ?  ? ?Orders Placed This Encounter  ?Procedures  ? CBC with Differential/Platelet  ? Comprehensive metabolic panel  ? Lipid panel  ? Urinalysis, Routine w reflex microscopic  ? TSH  ? Ambulatory referral to Sleep Studies  ?  ? ?No orders of the defined types were placed in this encounter. ?   ? ?Follow up plan: ?Return in about 1 year (around 10/03/2022). ? ? ?

## 2021-10-03 ENCOUNTER — Encounter: Payer: Self-pay | Admitting: Internal Medicine

## 2021-10-09 ENCOUNTER — Other Ambulatory Visit: Payer: Self-pay | Admitting: Internal Medicine

## 2021-10-09 ENCOUNTER — Other Ambulatory Visit (HOSPITAL_COMMUNITY): Payer: Self-pay

## 2021-10-10 ENCOUNTER — Other Ambulatory Visit (HOSPITAL_COMMUNITY): Payer: Self-pay

## 2021-10-10 MED ORDER — FLUOXETINE HCL 20 MG PO CAPS
ORAL_CAPSULE | Freq: Every day | ORAL | 1 refills | Status: DC
  Filled 2021-10-10: qty 30, 30d supply, fill #0

## 2021-10-10 NOTE — Telephone Encounter (Signed)
Requested Prescriptions  ?Pending Prescriptions Disp Refills  ?? FLUoxetine (PROZAC) 20 MG capsule 30 capsule 1  ?  Sig: TAKE 1 CAPSULE BY MOUTH ONCE A DAY  ?  ? Psychiatry:  Antidepressants - SSRI Passed - 10/09/2021  4:59 PM  ?  ?  Passed - Completed PHQ-2 or PHQ-9 in the last 360 days  ?  ?  Passed - Valid encounter within last 6 months  ?  Recent Outpatient Visits   ?      ? 1 week ago Sleep apnea, unspecified type  ? [facility] Vigg, Avanti, MD  ? 7 months ago Attention deficit disorder, unspecified hyperactivity presence  ? [facility] Vigg, Avanti, MD  ? 11 months ago Anemia, unspecified type  ? [facility] Vigg, Avanti, MD  ?  ?  ? ?  ?  ?  ? ?

## 2021-10-19 ENCOUNTER — Encounter: Payer: Self-pay | Admitting: Internal Medicine

## 2021-10-19 ENCOUNTER — Other Ambulatory Visit: Payer: Self-pay | Admitting: Internal Medicine

## 2021-10-19 DIAGNOSIS — G4730 Sleep apnea, unspecified: Secondary | ICD-10-CM | POA: Diagnosis not present

## 2021-10-19 DIAGNOSIS — F339 Major depressive disorder, recurrent, unspecified: Secondary | ICD-10-CM | POA: Diagnosis not present

## 2021-10-20 LAB — CBC WITH DIFFERENTIAL/PLATELET
Basophils Absolute: 0 10*3/uL (ref 0.0–0.2)
Basos: 1 %
EOS (ABSOLUTE): 0.1 10*3/uL (ref 0.0–0.4)
Eos: 2 %
Hematocrit: 37.9 % (ref 34.0–46.6)
Hemoglobin: 12.2 g/dL (ref 11.1–15.9)
Immature Grans (Abs): 0 10*3/uL (ref 0.0–0.1)
Immature Granulocytes: 0 %
Lymphocytes Absolute: 1.9 10*3/uL (ref 0.7–3.1)
Lymphs: 31 %
MCH: 26.1 pg — ABNORMAL LOW (ref 26.6–33.0)
MCHC: 32.2 g/dL (ref 31.5–35.7)
MCV: 81 fL (ref 79–97)
Monocytes Absolute: 0.5 10*3/uL (ref 0.1–0.9)
Monocytes: 8 %
Neutrophils Absolute: 3.7 10*3/uL (ref 1.4–7.0)
Neutrophils: 58 %
Platelets: 277 10*3/uL (ref 150–450)
RBC: 4.67 x10E6/uL (ref 3.77–5.28)
RDW: 14 % (ref 11.7–15.4)
WBC: 6.3 10*3/uL (ref 3.4–10.8)

## 2021-10-20 LAB — COMPREHENSIVE METABOLIC PANEL
ALT: 16 IU/L (ref 0–32)
AST: 18 IU/L (ref 0–40)
Albumin/Globulin Ratio: 1.8 (ref 1.2–2.2)
Albumin: 4.3 g/dL (ref 3.8–4.8)
Alkaline Phosphatase: 89 IU/L (ref 44–121)
BUN/Creatinine Ratio: 15 (ref 9–23)
BUN: 9 mg/dL (ref 6–20)
Bilirubin Total: 0.3 mg/dL (ref 0.0–1.2)
CO2: 25 mmol/L (ref 20–29)
Calcium: 9.4 mg/dL (ref 8.7–10.2)
Chloride: 100 mmol/L (ref 96–106)
Creatinine, Ser: 0.59 mg/dL (ref 0.57–1.00)
Globulin, Total: 2.4 g/dL (ref 1.5–4.5)
Glucose: 89 mg/dL (ref 70–99)
Potassium: 4.1 mmol/L (ref 3.5–5.2)
Sodium: 137 mmol/L (ref 134–144)
Total Protein: 6.7 g/dL (ref 6.0–8.5)
eGFR: 122 mL/min/{1.73_m2} (ref >59)

## 2021-10-20 LAB — LIPID PANEL
Chol/HDL Ratio: 4.4 ratio (ref 0.0–4.4)
Cholesterol, Total: 188 mg/dL (ref 100–199)
HDL: 43 mg/dL (ref >39)
LDL Chol Calc (NIH): 125 mg/dL — ABNORMAL HIGH (ref 0–99)
Triglycerides: 112 mg/dL (ref 0–149)
VLDL Cholesterol Cal: 20 mg/dL (ref 5–40)

## 2021-10-20 LAB — URINALYSIS, ROUTINE W REFLEX MICROSCOPIC
Bilirubin, UA: NEGATIVE
Glucose, UA: NEGATIVE
Ketones, UA: NEGATIVE
Leukocytes,UA: NEGATIVE
Nitrite, UA: NEGATIVE
Protein,UA: NEGATIVE
RBC, UA: NEGATIVE
Specific Gravity, UA: 1.012 (ref 1.005–1.030)
Urobilinogen, Ur: 0.2 mg/dL (ref 0.2–1.0)
pH, UA: 7 (ref 5.0–7.5)

## 2021-10-20 LAB — TSH: TSH: 1.79 u[IU]/mL (ref 0.450–4.500)

## 2021-10-23 ENCOUNTER — Ambulatory Visit
Admission: RE | Admit: 2021-10-23 | Discharge: 2021-10-23 | Disposition: A | Discharge status: Home / Self Care | Payer: 59 | Source: Ambulatory Visit | Attending: Emergency Medicine | Admitting: Emergency Medicine

## 2021-10-23 ENCOUNTER — Other Ambulatory Visit: Payer: Self-pay

## 2021-10-23 VITALS — BP 110/74 | HR 79 | Temp 99.4°F | Resp 18

## 2021-10-23 DIAGNOSIS — B9789 Other viral agents as the cause of diseases classified elsewhere: Secondary | ICD-10-CM | POA: Diagnosis not present

## 2021-10-23 DIAGNOSIS — J040 Acute laryngitis: Secondary | ICD-10-CM | POA: Diagnosis not present

## 2021-10-23 DIAGNOSIS — J988 Other specified respiratory disorders: Secondary | ICD-10-CM | POA: Insufficient documentation

## 2021-10-23 DIAGNOSIS — Z1152 Encounter for screening for COVID-19: Secondary | ICD-10-CM | POA: Insufficient documentation

## 2021-10-23 LAB — POCT RAPID STREP A (OFFICE): Rapid Strep A Screen: NEGATIVE

## 2021-10-23 MED ORDER — FLUTICASONE PROPIONATE 50 MCG/ACT NA SUSP: 2.0000 | Freq: Every day | NASAL | 0 refills | Status: DC

## 2021-10-23 NOTE — ED Triage Notes (Addendum)
Pt presents with cough,  ST and laryngitis x 3 days.  ?

## 2021-10-23 NOTE — ED Provider Notes (Signed)
HPI ? ?SUBJECTIVE: ? ?Cindy Hamilton is a 36 y.o. female who presents with sore throat starting yesterday.  She reports laryngitis, a cough occasionally productive of yellow-white sputum, starting today.  She has nasal congestion, which has resolved.  She was exposed to strep 4 days ago, and inhaled some sawdust 3 days ago while woodworking.  No fevers, body aches, headaches, rhinorrhea, sinus pain or pressure, loss sense of smell or taste, wheezing, chest pain, shortness of breath, nausea, vomiting, diarrhea, abdominal pain.  No allergy symptoms.  No antibiotics in the past month.  No antipyretic in the past 6 hours.  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  She got 3 doses of the COVID-vaccine and this years flu vaccine.  No aggravating factors.  She has not tried anything for this.  Symptoms are worse with talking.  She has a past medical history of asthma.  LMP: 2 to 3 weeks ago.  Denies possibility of being pregnant.  PCP: [facility] ? ? ? ?Past Medical History:  ?Diagnosis Date  ? Anemia   ? Anxiety   ? Asthma   ? Depression   ? Heart murmur   ? ? ?Past Surgical History:  ?Procedure Laterality Date  ? COLONOSCOPY    ? ? ?Family History  ?Problem Relation Age of Onset  ? Osteopenia Father   ? Prostate cancer Father   ? ? ?Social History  ? ?Tobacco Use  ? Smoking status: Never  ? Smokeless tobacco: Never  ?Vaping Use  ? Vaping Use: Never used  ?Substance Use Topics  ? Alcohol use: Never  ? Drug use: Never  ? ? ?No current facility-administered medications for this encounter. ? ?Current Outpatient Medications:  ?  fluticasone (FLONASE) 50 MCG/ACT nasal spray, Place 2 sprays into both nostrils daily., Disp: 16 g, Rfl: 0 ?  albuterol (VENTOLIN HFA) 108 (90 Base) MCG/ACT inhaler, Inhale 2 puffs into the lungs every 6 (six) hours as needed for wheezing or shortness of breath., Disp: 8.5 g, Rfl: 0 ?  cholecalciferol (VITAMIN D3) 25 MCG (1000 UNIT) tablet, Take  1,000 Units by mouth daily., Disp: , Rfl:  ?  diclofenac Sodium (VOLTAREN) 1 % GEL, Apply 2 grams topically 4 (four) times daily., Disp: 50 g, Rfl: 1 ?  famotidine (PEPCID) 20 MG tablet, Take 1 tablet (20 mg total) by mouth 2 (two) times daily., Disp: 90 tablet, Rfl: 0 ?  ferrous sulfate 325 (65 FE) MG EC tablet, Take 1 tablet (325 mg total) by mouth daily., Disp: 90 tablet, Rfl: 2 ?  FLUoxetine (PROZAC) 20 MG capsule, TAKE 1 CAPSULE BY MOUTH ONCE A DAY, Disp: 30 capsule, Rfl: 1 ?  valACYclovir (VALTREX) 1000 MG tablet, Take 1 tablet by mouth twice daily, Disp: 60 tablet, Rfl: 0 ? ?No Known Allergies ? ? ?ROS ? ?As noted in HPI.  ? ?Physical Exam ? ?BP 110/74 (BP Location: Left Arm)   Pulse 79   Temp 99.4 ?F (37.4 ?C) (Oral)   Resp 18   LMP 09/29/2021   SpO2 97%  ? ?Constitutional: Well developed, well nourished, no acute distress.  Positive laryngitis. ?Eyes:  EOMI, conjunctiva normal bilaterally ?HENT: Normocephalic, atraumatic,mucus membranes moist.  Positive nasal congestion.  Erythematous, swollen turbinates.  No sinus tenderness.  Tonsils normal without exudates.,  Uvula midline.  Positive cobblestoning, postnasal drip. ?Neck: No appreciable cervical lymphadenopathy ?Respiratory: Normal inspiratory effort, lungs clear bilaterally ?Cardiovascular: Normal rate, regular rhythm, no murmurs, rubs,  gallops. ?GI: nondistended ?skin: No rash, skin intact ?Musculoskeletal: no deformities ?Neurologic: Alert & oriented x 3, no focal neuro deficits ?Psychiatric: Speech and behavior appropriate ? ? ?ED Course ? ? ?Medications - No data to display ? ?Orders Placed This Encounter  ?Procedures  ? Covid-19, Flu A+B ([facility])  ?  Standing Status:   Standing  ?  Number of Occurrences:   1  ? POCT rapid strep A  ?  Standing Status:   Standing  ?  Number of Occurrences:   1  ? ? ?Results for orders placed or performed during the hospital encounter of 10/23/21 (from the past 24 hour(s))  ?POCT rapid strep A     Status: None  ?  Collection Time: 10/23/21  2:15 PM  ?Result Value Ref Range  ? Rapid Strep A Screen Negative Negative  ? ?No results found. ? ?ED Clinical Impression ? ?1. Laryngitis   ?2. Encounter for screening for COVID-19   ?3. Viral respiratory illness   ?  ? ?ED Assessment/Plan ? ?Patient with a laryngitis, difficult to tell whether there is exposure to sawdust or whether she has a viral illness.  Checking strep, COVID and flu.  She qualifies for antivirals based on history of asthma if either 1 of these are positive.  In the meantime, saline nasal irrigation, Flonase, Mucinex D.  Work note for tomorrow until testing is back. ? ?COVID, flu testing pending at the time of initial signing of this note. ? ?Discussed labs, MDM, treatment plan, and plan for follow-up with patient. patient agrees with plan.  ? ?Meds ordered this encounter  ?Medications  ? fluticasone (FLONASE) 50 MCG/ACT nasal spray  ?  Sig: Place 2 sprays into both nostrils daily.  ?  Dispense:  16 g  ?  Refill:  0  ? ? ? ? ?*This clinic note was created using Scientist, clinical (histocompatibility and immunogenetics). Therefore, there may be occasional mistakes despite careful proofreading. ? ?? ? ?  ?Domenick Gong, MD ?10/24/21 3536 ? ?

## 2021-10-23 NOTE — Discharge Instructions (Addendum)
Your rapid strep was negative.  I have sent off a throat culture to confirm absence of strep throat.  If your COVID or flu come back positive, I will prescribe Molnupiravir or Tamiflu. saline nasal irrigation with a [facility] Med rinse and distilled water as often as you want, Flonase, Mucinex D. ?

## 2021-10-25 LAB — COVID-19, FLU A+B NAA
Influenza A, NAA: NOT DETECTED
Influenza B, NAA: NOT DETECTED
SARS-CoV-2, NAA: NOT DETECTED

## 2021-10-26 LAB — CULTURE, GROUP A STREP (THRC)

## 2021-11-12 ENCOUNTER — Ambulatory Visit: Payer: 59 | Admitting: Psychology

## 2021-11-15 ENCOUNTER — Encounter: Payer: 59 | Attending: Psychology | Admitting: Psychology

## 2021-11-15 DIAGNOSIS — F908 Attention-deficit hyperactivity disorder, other type: Secondary | ICD-10-CM | POA: Insufficient documentation

## 2021-11-15 DIAGNOSIS — F418 Other specified anxiety disorders: Secondary | ICD-10-CM | POA: Diagnosis not present

## 2021-11-15 DIAGNOSIS — F5105 Insomnia due to other mental disorder: Secondary | ICD-10-CM | POA: Insufficient documentation

## 2021-11-22 NOTE — Progress Notes (Signed)
11/15/2021:  3 PM- 4 PM: ? ?Today I provided feedback and recommendations from neuropsychological evaluation conducted and completed on 3/2.  Below I provided a copy of the summery below for review. ? ?Impression/Diagnosis:                     Overall, the results of the current neuropsychological evaluation taking into account the patient's past medical history and subjective reports of attentional issues and hyperkinesis going back to childhood as well as objective assessment of a wide range of attention and concentration variables do show considerable consistencies.  The patient shows patterns consistent with adult residual attention deficit disorder and patterns that display attentional deficits that are not those typically seen as primarily caused by anxiety or depressive symptomatology.  In fact, some of the attentional variables most sensitive to depression and anxiety (auditory encoding capacity) are her best areas of performance on these measures and she performed in the very superior range relative to a normative population on encoding capacity variables.  The patient shows patterns consistent with difficulty shifting between environmental stimuli and attentional targets, difficulty remaining free from external distractors.  While these were not severe relative to a normative population the patient is an extremely smart individual who is intellectual capacity and resulting educational and occupational demands would place a heavy burden on these attentional components.  This would result in clear difficulties in such high demanding situations.  Of particular note and the variable that is most consistent with attention deficit disorder is being one of the etiological factors had to do with significant impairments for sustaining attention.  Typically individuals are able to sustain rather efficient and consistent performance across 22 minutes of time.  The patient was administered a 15-minute continuous  performance measure and she began showing very early deterioration in cognitive functioning including information processing speed, increased lapses of attention as well as some degree of difficulties inhibiting impulsive responding.  The first sign of this deterioration occurred after just 3 to 6 minutes of time and showed significant worsening until she had slowed her average response times by more than 150 ms and was having between 3 and 5 errors of omission and 1-2 errors of commission during 3 minutes timeframes. ? ?While the objective assessment and subjective symptoms and descriptions of attentional issues and complications going back to elementary school are consistent with a diagnosis of adult residual attention deficit disorder there are clearly some elements that are playing a deleterious role and need to be addressed.  The patient describes anxiety and depressive symptomatology and feeling overwhelmed at times.  While there are anxiety and attentional variables present I do think that the causal direction of these issues are likely attentional weaknesses and difficulties managing and coping with her very demanding occupational expectations triggering anxiety and frustration and feelings of being overwhelmed.  The patient is having to place a great deal of effort staying on task and maintaining her attention in an occupational setting where attention and particularly sustained attention are paramount.  The patient has all of the intellectual and cognitive abilities to do these tasks.  While anxiety and depression can cause attentional deficits I do think that her attentional deficits are the trigger for her anxiety and depression rather than vice versa. ? ?Another variable that needs to be directly addressed is a very real possibility that the patient has obstructive sleep apnea and while this may not be causative in nature for all of her difficulties, it is at  least playing a disruptive role in her  attentional capacity if present.  If the patient does have obstructive sleep apnea, effectively treating it would likely have one of the most helpful and beneficial interventions available to the patient.  Even without this in consideration, the patient is chronically sleep deprived and has difficulty falling asleep and regularly gets only around 6 hours of sleep each night.  Diligent work on sleep hygiene's and extending her sleep duration will also be an important intervention going forward. ? ?As far as potential psychotropic interventions the patient does continue to take fluoxetine and feels like it is helpful for her overall.  Given the results of the objective neuropsychological assessment she does also look like a good candidate for a trial of psychostimulant medications.  I think the most conservative approach would be to start her out with a trial of generic methylphenidate, which tends to have a relatively short clinical duration of 2-1/2 to 3 hours.  This would allow her to assess whether or not she was having any type of negative impact on anxiety.  The patient is extremely intelligent and would be able to manage medications and use multiple self directed doses during the day allowing her to utilize it twice per day and even potentially 3 times in a single day if warranted.  She should be cautioned that using it in the late afternoon or early evening could have a significant negative impact on her sleep pattern which is already impaired.  I suspect that the patient will experience some improvement with psychostimulant medications but I will leave any specific recommendations up to her treating physician. ? ?  ?Diagnosis:                              ?  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  ?Depression with anxiety ?  ?Insomnia secondary to depression with anxiety ?  ?  ?_____________________ ?Arley Phenix, Psy.D. ?Clinical Neuropsychologist ?

## 2021-11-27 ENCOUNTER — Encounter: Payer: Self-pay | Admitting: Internal Medicine

## 2021-11-27 ENCOUNTER — Other Ambulatory Visit: Payer: Self-pay

## 2021-11-27 ENCOUNTER — Other Ambulatory Visit: Payer: Self-pay | Admitting: Family Medicine

## 2021-11-27 ENCOUNTER — Other Ambulatory Visit (HOSPITAL_COMMUNITY): Payer: Self-pay

## 2021-11-27 MED ORDER — FLUOXETINE HCL 20 MG PO CAPS
20.0000 mg | ORAL_CAPSULE | Freq: Every day | ORAL | 0 refills | Status: DC
  Filled 2021-11-27 – 2021-11-30 (×2): qty 90, 90d supply, fill #0

## 2021-11-27 MED ORDER — FLUOXETINE HCL 20 MG PO CAPS: 20.0000 mg | ORAL_CAPSULE | Freq: Every day | ORAL | 0 refills | Status: DC

## 2021-11-27 NOTE — Telephone Encounter (Signed)
Pt scheduled for physical on 7/12 ?

## 2021-11-27 NOTE — Telephone Encounter (Signed)
Last seen on 10/02/21 ? ?No up coming appts at this time.  ?

## 2021-11-27 NOTE — Telephone Encounter (Signed)
Patient would like medication sent to [place] ?

## 2021-11-27 NOTE — Telephone Encounter (Signed)
Please get scheduled for physical. Refill given today.  ?

## 2021-11-29 NOTE — Telephone Encounter (Signed)
Needs psychiarty referral please let her know thnx.

## 2021-11-30 ENCOUNTER — Other Ambulatory Visit (HOSPITAL_COMMUNITY): Payer: Self-pay

## 2021-12-06 ENCOUNTER — Encounter: Payer: Self-pay | Admitting: Internal Medicine

## 2021-12-09 ENCOUNTER — Telehealth: Payer: Self-pay | Admitting: Family Medicine

## 2021-12-09 NOTE — Telephone Encounter (Signed)
Following is a message that the patient sent as a staff message regarding her health issues ?She does have a new patient appointment on the 24th coming up  ? ?Thanks-Dr. Lorin Picket  ? ?Carnella Guadalajara, PA-C  Babs Sciara, MD ?Thanks so much, Dr. Gerda Diss!  ? ?I'm pretty simple, but have a few acute (non-urgent) needs -  ?- Due for annual physical for insurance purposes  ?- Hoping for referral for sleep study (daytime hypersomnolence, snoring, obesity, strong family history of OSA even in my thin relatives)  ?- Recently diagnosed with adult residual ADHD (have struggled with symptoms all my life, but only recently went for cognitive testing with Dr. Kieth Brightly in [place] - he recommended medication; whether this is something you feel comfortable prescribing or want to refer to a treating psychiatrist, I am good either way)  ? ?Chronic conditions:  ?- Mild asthma, prn albuterol  ?- Anxiety / depression, relatively stable on fluoxetine  ?- Obesity (BMI 37.3)  ?- Iron deficiency anemia from heavy periods  ?- History of anorexia  ?- Frequent cold sores, on prn valacyclovir  ? ?No need to move heaven and earth to fit me in, but if you have anything in the next months or two, that would be great!  If possible, the first or last slot of the day would be helpful so that I can work around my clinic hours.  ? ?Again, thanks so much!  Looking forward to meeting you in person, and appreciative of your care!  ? ?~ [place]in   ?  ?   ? ?

## 2021-12-19 ENCOUNTER — Encounter: Payer: Self-pay | Admitting: Family Medicine

## 2021-12-19 ENCOUNTER — Ambulatory Visit: Payer: 59 | Admitting: Family Medicine

## 2021-12-19 VITALS — BP 125/84 | HR 90 | Temp 98.2°F | Ht 69.49 in | Wt 269.0 lb

## 2021-12-19 DIAGNOSIS — G4730 Sleep apnea, unspecified: Secondary | ICD-10-CM

## 2021-12-19 DIAGNOSIS — F339 Major depressive disorder, recurrent, unspecified: Secondary | ICD-10-CM

## 2021-12-19 DIAGNOSIS — J4520 Mild intermittent asthma, uncomplicated: Secondary | ICD-10-CM | POA: Diagnosis not present

## 2021-12-19 DIAGNOSIS — F902 Attention-deficit hyperactivity disorder, combined type: Secondary | ICD-10-CM | POA: Diagnosis not present

## 2021-12-19 DIAGNOSIS — F988 Other specified behavioral and emotional disorders with onset usually occurring in childhood and adolescence: Secondary | ICD-10-CM

## 2021-12-19 DIAGNOSIS — J45909 Unspecified asthma, uncomplicated: Secondary | ICD-10-CM

## 2021-12-19 HISTORY — DX: Unspecified asthma, uncomplicated: J45.909

## 2021-12-19 HISTORY — DX: Other specified behavioral and emotional disorders with onset usually occurring in childhood and adolescence: F98.8

## 2021-12-19 MED ORDER — FLUTICASONE PROPIONATE HFA 110 MCG/ACT IN AERO
INHALATION_SPRAY | RESPIRATORY_TRACT | 12 refills | Status: DC
  Filled 2021-12-19: qty 12, 30d supply, fill #0
  Filled 2022-03-11: qty 12, 30d supply, fill #1
  Filled 2022-07-04: qty 12, 30d supply, fill #2

## 2021-12-19 MED ORDER — FLUOXETINE HCL 20 MG PO CAPS
20.0000 mg | ORAL_CAPSULE | Freq: Every day | ORAL | 1 refills | Status: DC
  Filled 2021-12-19 – 2022-03-11 (×2): qty 90, 90d supply, fill #0
  Filled 2022-06-24: qty 90, 90d supply, fill #1

## 2021-12-19 MED ORDER — METHYLPHENIDATE HCL ER (CD) 20 MG PO CPCR
20.0000 mg | ORAL_CAPSULE | ORAL | 0 refills | Status: DC
  Filled 2021-12-19: qty 30, 30d supply, fill #0

## 2021-12-19 NOTE — Progress Notes (Signed)
   Subjective:    Patient ID: Cindy Hamilton, female    DOB: [birth year], 36 y.o.   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 Attention deficit hyperactivity disorder (ADHD), combined type We did discuss the diagnosis Psychology note reviewed Methylphenidate extended release 20 mg would be reasonable Patient denies history of drug abuse or substance abuse Patient did have anorexia when she was in high school but she states she no longer has that problem Does at times have some elements of binge eating so it is possible that this medication will help her ADD as well as curb her appetite Healthy diet was discussed Side effects of medication were discussed If patient has any progressive troubles or problems she is to notify us She is to give [place] feedback within a few weeks how she is doing I like to see her back within 3 to 4 weeks to recheck blood pressure may need to adjust dose  2. Sleep apnea, unspecified type Referral for sleep study with Dr. Frances Furbish  3. Morbid obesity (HCC) Portion control regular activity recommended patient states even when she is worked hard at her diet her weights come down no more than bottom of 200 pounds.  Any weight loss 20 to 30 pounds would be beneficial for the patient's overall health  4. Depression, recurrent (HCC) Patient does well on fluoxetine moods are doing well currently continue current measures  5. Mild intermittent reactive airway disease without complication Albuterol as a rescue inhaler but she is utilizing it way too often I believe this is because she has uncontrolled inflammation with reactive  airway she would benefit from Flovent 2 puffs twice daily if insurance does not cover that we would try to utilize 1 that would be covered  Female wellness exam sometime this summer  Previous lab work reviewed

## 2021-12-20 ENCOUNTER — Other Ambulatory Visit (HOSPITAL_COMMUNITY): Payer: Self-pay

## 2021-12-20 ENCOUNTER — Other Ambulatory Visit: Payer: Self-pay

## 2021-12-20 DIAGNOSIS — G4730 Sleep apnea, unspecified: Secondary | ICD-10-CM

## 2021-12-24 ENCOUNTER — Encounter: Payer: Self-pay | Admitting: Family Medicine

## 2021-12-24 DIAGNOSIS — B001 Herpesviral vesicular dermatitis: Secondary | ICD-10-CM

## 2021-12-24 DIAGNOSIS — D509 Iron deficiency anemia, unspecified: Secondary | ICD-10-CM

## 2021-12-24 DIAGNOSIS — K219 Gastro-esophageal reflux disease without esophagitis: Secondary | ICD-10-CM

## 2021-12-24 DIAGNOSIS — Z8619 Personal history of other infectious and parasitic diseases: Secondary | ICD-10-CM

## 2021-12-24 DIAGNOSIS — Z8659 Personal history of other mental and behavioral disorders: Secondary | ICD-10-CM | POA: Insufficient documentation

## 2021-12-24 HISTORY — DX: Gastro-esophageal reflux disease without esophagitis: K21.9

## 2021-12-24 HISTORY — DX: Iron deficiency anemia, unspecified: D50.9

## 2021-12-24 HISTORY — DX: Herpesviral vesicular dermatitis: B00.1

## 2021-12-24 HISTORY — DX: Personal history of other infectious and parasitic diseases: Z86.19

## 2022-01-02 ENCOUNTER — Other Ambulatory Visit (HOSPITAL_COMMUNITY): Payer: Self-pay

## 2022-01-02 ENCOUNTER — Other Ambulatory Visit: Payer: Self-pay | Admitting: Family Medicine

## 2022-01-02 NOTE — Telephone Encounter (Signed)
90 days with 1 refill

## 2022-01-03 ENCOUNTER — Other Ambulatory Visit (HOSPITAL_COMMUNITY): Payer: Self-pay

## 2022-01-03 MED ORDER — FAMOTIDINE 20 MG PO TABS
20.0000 mg | ORAL_TABLET | Freq: Two times a day (BID) | ORAL | 1 refills | Status: AC
  Filled 2022-01-03: qty 90, 45d supply, fill #0
  Filled 2022-02-12: qty 90, 45d supply, fill #1

## 2022-01-14 ENCOUNTER — Other Ambulatory Visit (HOSPITAL_COMMUNITY): Payer: Self-pay

## 2022-01-14 ENCOUNTER — Other Ambulatory Visit: Payer: Self-pay | Admitting: Family Medicine

## 2022-01-14 MED ORDER — METHYLPHENIDATE HCL ER (CD) 30 MG PO CPCR
30.0000 mg | ORAL_CAPSULE | ORAL | 0 refills | Status: DC
  Filled 2022-01-14 – 2022-01-15 (×2): qty 30, 30d supply, fill #0

## 2022-01-15 ENCOUNTER — Other Ambulatory Visit (HOSPITAL_COMMUNITY): Payer: Self-pay

## 2022-01-15 DIAGNOSIS — Z124 Encounter for screening for malignant neoplasm of cervix: Secondary | ICD-10-CM | POA: Diagnosis not present

## 2022-01-15 DIAGNOSIS — Z01419 Encounter for gynecological examination (general) (routine) without abnormal findings: Secondary | ICD-10-CM | POA: Diagnosis not present

## 2022-01-16 ENCOUNTER — Encounter: Payer: Self-pay | Admitting: Nurse Practitioner

## 2022-01-16 ENCOUNTER — Ambulatory Visit (INDEPENDENT_AMBULATORY_CARE_PROVIDER_SITE_OTHER): Payer: 59 | Admitting: Nurse Practitioner

## 2022-01-16 VITALS — BP 118/74 | HR 90 | Temp 98.2°F | Ht 68.25 in | Wt 264.6 lb

## 2022-01-16 DIAGNOSIS — L739 Follicular disorder, unspecified: Secondary | ICD-10-CM

## 2022-01-16 DIAGNOSIS — Z124 Encounter for screening for malignant neoplasm of cervix: Secondary | ICD-10-CM | POA: Diagnosis not present

## 2022-01-16 DIAGNOSIS — Z01419 Encounter for gynecological examination (general) (routine) without abnormal findings: Secondary | ICD-10-CM

## 2022-01-16 NOTE — Progress Notes (Signed)
Subjective:    Patient ID: Cindy Hamilton, female    DOB: [birth year], 36 y.o.   MRN: [id number]  HPI The patient comes in today for a wellness visit.  A review of their health history was completed.  A review of medications was also completed.  Any needed refills; none   Eating habits: Fair  Falls/  MVA accidents in past few months: none  Regular exercise: 30-60 minutes weekly exercise walking low grade hiking  Specialist pt sees on regular basis: none  Preventative health issues were discussed.   Patient declines HIV and hepatitis C testing today stating that she has low risk.  Patient states she will look into getting her tetanus shot from local pharmacy.  Additional concerns:    Patient states that she occasionally will get sores to her bottom and groin area and wants to know if anything can be done for them.   Review of Systems  Skin:        Bumps to the perineum.       Objective:   Physical Exam Vitals reviewed. Exam conducted with a chaperone present.  Constitutional:      General: She is not in acute distress.    Appearance: Normal appearance. She is normal weight. She is not ill-appearing, toxic-appearing or diaphoretic.  HENT:     Head: Normocephalic and atraumatic.  Cardiovascular:     Rate and Rhythm: Normal rate and regular rhythm.     Pulses: Normal pulses.     Heart sounds: Normal heart sounds. No murmur heard. Pulmonary:     Effort: Pulmonary effort is normal. No respiratory distress.     Breath sounds: Normal breath sounds. No wheezing.  Chest:     Chest wall: No mass, lacerations, deformity, swelling, tenderness, crepitus or edema. There is no dullness to percussion.  Breasts:    Breasts are symmetrical.     Right: Normal. No swelling, bleeding, inverted nipple, mass, nipple discharge, skin change or tenderness.     Left: Normal. No swelling, bleeding, inverted nipple, mass, nipple discharge, skin change or tenderness.     Comments: Dense  breast tissue to bilateral breast Abdominal:     Hernia: There is no hernia in the left inguinal area or right inguinal area.  Genitourinary:    Exam position: Lithotomy position.     Pubic Area: No rash or pubic lice.      Labia:        Right: No rash, tenderness, lesion or injury.        Left: No rash, tenderness, lesion or injury.      Urethra: No prolapse, urethral pain, urethral swelling or urethral lesion.     Vagina: Normal. No signs of injury and foreign body. No vaginal discharge, erythema, tenderness, bleeding, lesions or prolapsed vaginal walls.     Cervix: Lesion and cervical bleeding present. No cervical motion tenderness, discharge, friability, erythema or eversion.     Uterus: Normal. Not deviated, not enlarged, not fixed, not tender and no uterine prolapse.      Adnexa: Right adnexa normal and left adnexa normal.       Right: No mass, tenderness or fullness.         Left: No mass, tenderness or fullness.       Comments: Multiple small pustules noted to gluteal fold.  Area is mildly erythemic.  No swelling noted no purulent discharge noted. Also multiple areas of scar tissue noted to gluteal fold.    Small amount of  menstrual blood noted coming from cervical os.  3 small nabothian cyst noted to 7:00 to 8:00 area of the cervix.    Rectum grossly intact Musculoskeletal:     Cervical back: Normal range of motion and neck supple. No rigidity or tenderness.     Comments: Grossly intact  Lymphadenopathy:     Cervical: No cervical adenopathy.     Upper Body:     Right upper body: No supraclavicular, axillary or pectoral adenopathy.     Left upper body: No supraclavicular, axillary or pectoral adenopathy.     Lower Body: No right inguinal adenopathy. No left inguinal adenopathy.  Skin:    General: Skin is warm.     Capillary Refill: Capillary refill takes less than 2 seconds.  Neurological:     Mental Status: She is alert.     Comments: Grossly intact  Psychiatric:         Mood and Affect: Mood normal.        Behavior: Behavior normal.         Assessment & Plan:   1. Well woman exam with routine gynecological exam -Adult wellness-complete.wellness physical was conducted today. Importance of diet and exercise were discussed in detail.  Importance of stress reduction and healthy living were discussed.  In addition to this a discussion regarding safety was also covered.  We also reviewed over immunizations and gave recommendations regarding current immunization needed for age.   In addition to this additional areas were also touched on including: Preventative health exams needed:  Colonoscopy not indicated Declined HIV and Hep C screening due to being low risk.   Patient was advised yearly wellness exam  - IGP, rfx Aptima HPV ASCU  2. Cervical cancer screening - IGP, rfx Aptima HPV ASCU  3. Folliculitis of perineum -Likely hidradenitis suppurativa -Patient states that areas are currently tolerable -Patient encouraged to lose weight to decrease prevalence of follicular lesions. -If areas do not improve we will consider treatment with doxycycline and/or referral to dermatology   Follow-up in 1 year for annual exam     Note:  This document was prepared using Dragon voice recognition software and may include unintentional dictation errors. Note - This record has been created using [employer].  Chart creation errors have been sought, but may not always  have been located. Such creation errors do not reflect on  the standard of medical care.

## 2022-01-17 ENCOUNTER — Encounter: Payer: Self-pay | Admitting: Nurse Practitioner

## 2022-01-22 LAB — IGP, RFX APTIMA HPV ASCU

## 2022-01-30 ENCOUNTER — Ambulatory Visit: Payer: 59 | Admitting: Family Medicine

## 2022-01-30 ENCOUNTER — Encounter: Payer: Self-pay | Admitting: Family Medicine

## 2022-01-30 VITALS — BP 118/62 | Wt 266.4 lb

## 2022-01-30 DIAGNOSIS — R400 Somnolence: Secondary | ICD-10-CM | POA: Diagnosis not present

## 2022-01-30 DIAGNOSIS — R0683 Snoring: Secondary | ICD-10-CM | POA: Diagnosis not present

## 2022-01-30 DIAGNOSIS — F902 Attention-deficit hyperactivity disorder, combined type: Secondary | ICD-10-CM

## 2022-01-30 MED ORDER — METHYLPHENIDATE HCL ER (CD) 40 MG PO CPCR
40.0000 mg | ORAL_CAPSULE | ORAL | 0 refills | Status: DC
  Filled 2022-01-30: qty 30, 30d supply, fill #0

## 2022-01-30 NOTE — Progress Notes (Signed)
   Subjective:    Patient ID: Cindy Hamilton, female    DOB: Apr 28, 1986, 36 y.o.   MRN: [id number]  HPI Pt arrives today to follow up on blood pressure. Pt states no issues with blood pressure at this time.  Patient states she is doing a good job watching salt in her diet trying to stay active  Has adult ADD and recently was started on Metadate was recently increased states she does not notice much improvement but she does state that she is not having daytime somnolence quite as much as she was  She has had sleep related issues for years snores ever since being a child and states over the past 2 years she has been married and her husband states that she snores at night and stops breathing She finds herself sleepy during the day falling asleep fairly easily does not fall asleep driving  This is dramatically affecting her quality of life and puts her at risk for multiple other health issues it is imperative that evaluation and study be done in the near future  Review of Systems     Objective:   Physical Exam  General-in no acute distress Eyes-no discharge Lungs-respiratory rate normal, CTA CV-no murmurs,RRR Extremities skin warm dry no edema Neuro grossly normal Behavior normal, alert       Assessment & Plan:   1. Attention deficit hyperactivity disorder (ADHD), combined type We will go ahead and send in new dosage of the medicine she will give [place] feedback in 3 to 4 weeks how that is doing the goal is to titrate up the medicine to see maximal symptom improvement without significant side effects  2. Daytime somnolence I suspect that she has sleep apnea based upon physical nature as well as snoring at nighttime daytime somnolence and stopping breathing at night needs sleep study  3. Snoring See above This is a update of patient's underlying sleep aspects to help qualify her for consultation and sleep study this will be sent to [facility] so they can move  forward with helping with the consultation and sleep study.  Patient would benefit from this greatly.

## 2022-01-31 ENCOUNTER — Other Ambulatory Visit (HOSPITAL_COMMUNITY): Payer: Self-pay

## 2022-01-31 NOTE — Progress Notes (Signed)
01/31/22-recent office note faxed to [facility]

## 2022-02-06 ENCOUNTER — Encounter: Payer: 59 | Admitting: Physician Assistant

## 2022-02-12 ENCOUNTER — Other Ambulatory Visit (HOSPITAL_COMMUNITY): Payer: Self-pay

## 2022-02-14 ENCOUNTER — Encounter: Payer: Self-pay | Admitting: Family Medicine

## 2022-02-14 ENCOUNTER — Other Ambulatory Visit: Payer: Self-pay | Admitting: Family Medicine

## 2022-02-14 MED ORDER — METHYLPHENIDATE HCL ER (CD) 30 MG PO CPCR
30.0000 mg | ORAL_CAPSULE | ORAL | 0 refills | Status: DC
  Filled 2022-02-14: qty 30, 30d supply, fill #0

## 2022-02-15 ENCOUNTER — Other Ambulatory Visit (HOSPITAL_COMMUNITY): Payer: Self-pay

## 2022-02-18 ENCOUNTER — Encounter: Payer: Self-pay | Admitting: Family Medicine

## 2022-02-19 ENCOUNTER — Other Ambulatory Visit: Payer: Self-pay | Admitting: *Deleted

## 2022-02-19 ENCOUNTER — Telehealth: Payer: Self-pay | Admitting: Gastroenterology

## 2022-02-19 DIAGNOSIS — K219 Gastro-esophageal reflux disease without esophagitis: Secondary | ICD-10-CM

## 2022-02-19 NOTE — Telephone Encounter (Signed)
Nurses note Please go ahead with referral to gastroenterology for GERD You may share the message with [place]  Hi [facility] Referral has been made In the meantime continue your current famotidine-they may well switch you to a PPI.  Also minimize foods which can aggravate GERD including caffeine's, chocolates, tomato-based products.  Avoid meals within 90 minutes of bedtime if possible.  And consider putting your bed on a slight incline (6 inch blocks under the head of the bed) Hope all goes well with your consult [facility]-Dr. Lorin Picket

## 2022-02-19 NOTE — Telephone Encounter (Signed)
Stacey/Susan:  Please put patient in with me on Tuesday, August 1st, at 4pm. She willl be IN PERSON. Referral is already in system. 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 Thanks!

## 2022-02-26 ENCOUNTER — Encounter: Payer: Self-pay | Admitting: Gastroenterology

## 2022-02-26 ENCOUNTER — Ambulatory Visit (INDEPENDENT_AMBULATORY_CARE_PROVIDER_SITE_OTHER): Payer: 59 | Admitting: Gastroenterology

## 2022-02-26 VITALS — BP 128/83 | HR 73 | Temp 97.7°F | Ht 69.0 in | Wt 261.6 lb

## 2022-02-26 DIAGNOSIS — R109 Unspecified abdominal pain: Secondary | ICD-10-CM | POA: Insufficient documentation

## 2022-02-26 DIAGNOSIS — D500 Iron deficiency anemia secondary to blood loss (chronic): Secondary | ICD-10-CM | POA: Diagnosis not present

## 2022-02-26 NOTE — Progress Notes (Signed)
Gastroenterology Office Note    Referring Provider: Babs Sciara, MD Primary Care Physician:  Babs Sciara, MD  Primary GI: Dr. Marletta Lor   Chief Complaint   Chief Complaint  Patient presents with   Abdominal Pain    Epi gastric/upper quan pain x 2 to 3 weeks. No diarrhea or constipation. No nausea     History of Present Illness   Cindy Hamilton is a 36 y.o. female presenting today at the request of Luking, Jonna Coup, MD due to abdominal pain. She is a PA by profession, working at the [employer].  She notes after July 16th, she had intermittent abdominal pain that woke her from sleep, lasting about 3-4 hours. Both times occurred after eating a heavier meal. She notes chronic burping but worsening recently. Gnawing epigastric pain awakened her from sleep. She has no significant improvement with Pepcid. Slight relief with burping. Ate bland food about a week. Happened again after eating chips. Noted RUQ. She has been following a low fat diet, bland. NO pain with bland diet. No spicy foods. Limiting coffee intake. She does have a history of chronic GERD and had started OTC omeprazole in morning. No NSAIDs. Stomach will feel like "gnawing" if empty with mild improvement after eating. No dysphagia.   History of IDA. She would like to have iron studies drawn.     5 lbs lost in past 2 weeks after eating bland.      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  Takes ferrous sulfate 325 mg daily   Reactive airway disease 12/19/2021   Worse during  seasonal allergy peaks    Past Surgical History:  Procedure Laterality Date   COLONOSCOPY     remote past in [place].    Current Outpatient Medications  Medication Sig Dispense Refill   albuterol (VENTOLIN HFA) 108 (90 Base) MCG/ACT inhaler Inhale 2 puffs into the lungs every 6 (six) hours as needed for wheezing or shortness of breath. 8.5 g 0   cholecalciferol (VITAMIN D3) 25 MCG (1000 UNIT) tablet Take 1,000 Units by mouth daily.     famotidine (PEPCID) 20 MG tablet Take 1 tablet (20 mg total) by mouth 2 (two) times daily. 90 tablet 1   ferrous sulfate 325 (65 FE) MG EC tablet Take 1 tablet (325 mg total) by mouth daily. 90 tablet 2   FLUoxetine (PROZAC) 20 MG capsule Take 1 capsule by mouth daily. 90 capsule 1   fluticasone (FLOVENT HFA) 110 MCG/ACT inhaler Use 2 puffs twice a day as directed to help control reactive airway 12 g 12   methylphenidate (METADATE CD) 30 MG CR capsule Take 1 capsule (30 mg total) by mouth every morning. 30 capsule 0   valACYclovir (VALTREX) 1000 MG tablet Take 1 tablet by mouth twice daily 60 tablet 0   No current facility-administered medications for this visit.    Allergies as of 02/26/2022   (No Known Allergies)    Family History  Problem Relation Age of Onset  Osteopenia Father    Prostate cancer Father    Colon polyps Brother        unknown if adenomas   Cancer Maternal Grandmother        Non-Hodgkin's lymphoma   Diabetes Maternal Grandfather    Heart disease Maternal Grandfather    Prostate cancer Paternal Grandfather    Cancer Paternal Aunt        Ovarian   Colon cancer Neg Hx     Social History   Socioeconomic History   Marital status: Married    Spouse name: Not on file   Number of children: Not on file   Years of education: Not on file   Highest education level: Not on file  Occupational History   Not on file  Tobacco Use   Smoking status: Never   Smokeless tobacco: Never  Vaping Use   Vaping Use: Never used   Substance and Sexual Activity   Alcohol use: Never   Drug use: Never   Sexual activity: Yes  Other Topics Concern   Not on file  Social History Narrative   Not on file   Social Determinants of Health   Financial Resource Strain: Not on file  Food Insecurity: Not on file  Transportation Needs: Not on file  Physical Activity: Not on file  Stress: Not on file  Social Connections: Not on file  Intimate Partner Violence: Not on file     Review of Systems   Gen: Denies any fever, chills, fatigue, weight loss, lack of appetite.  CV: Denies chest pain, heart palpitations, peripheral edema, syncope.  Resp: Denies shortness of breath at rest or with exertion. Denies wheezing or cough.  GI: see HPI GU : Denies urinary burning, urinary frequency, urinary hesitancy MS: Denies joint pain, muscle weakness, cramps, or limitation of movement.  Derm: Denies rash, itching, dry skin Psych: Denies depression, anxiety, memory loss, and confusion Heme: Denies bruising, bleeding, and enlarged lymph nodes.   Physical Exam   BP 128/83   Pulse 73   Temp 97.7 F (36.5 C)   Ht 5\' 9"  (1.753 m)   Wt 261 lb 9.6 oz (118.7 kg)   LMP 02/17/2022   BMI 38.63 kg/m  General:   Alert and oriented. Pleasant and cooperative. Well-nourished and well-developed.  Head:  Normocephalic and atraumatic. Eyes:  Without icterus Ears:  Normal auditory acuity. Lungs:  Clear to auscultation bilaterally.  Heart:  S1, S2 present without murmurs appreciated.  Abdomen:  +BS, soft, non-tender and non-distended. No HSM noted. No guarding or rebound. No masses appreciated.  Rectal:  Deferred  Msk:  Symmetrical without gross deformities. Normal posture. Extremities:  Without edema. Neurologic:  Alert and  oriented x4;  grossly normal neurologically. Skin:  Intact without significant lesions or rashes. Psych:  Alert and cooperative. Normal mood and affect.   Assessment   Cindy Hamilton is a 36 y.o. female  presenting today with at the request of Dr. Wolfgang Phoenix due to abdominal pain.  Her symptoms seem predominantly biliary in nature; she has noticed improvement with bland, low fat foods. Unable to rule out GERD/gastritis at this moment, but we will start with an [place] and labs. May ultimately need HIDA. We also discussed possible EGD but will await US findings first.   History of IDA: in setting of heavy menses. She is requesting to update these labs today as well.       PLAN   RUQ [place] CMP, iron studies Consider surgery referral vs HIDA vs  EGD Further recommendations to follow   Gelene Mink, PhD, ANP-BC [facility]

## 2022-02-26 NOTE — Patient Instructions (Signed)
Please have blood work completed when you are able.  I have ordered an ultrasound!  We will make a decision on next best step once labs and imaging are reviewed!  Please message with any recurrent pain!  It was a pleasure to see you today. I want to create trusting relationships with patients to provide genuine, compassionate, and quality care. I value your feedback. If you receive a survey regarding your visit,  I greatly appreciate you taking time to fill this out.   Gelene Mink, PhD, ANP-BC [facility]

## 2022-02-27 ENCOUNTER — Telehealth: Payer: Self-pay | Admitting: *Deleted

## 2022-02-27 NOTE — Telephone Encounter (Signed)
Patient informed of [place] procedure 03/07/22 at 11:30 am , arrive at 11:15 am and NPO 8 hours prior to procedure. Verbalized understanding

## 2022-03-07 ENCOUNTER — Ambulatory Visit (HOSPITAL_COMMUNITY)
Admission: RE | Admit: 2022-03-07 | Discharge: 2022-03-07 | Disposition: A | Discharge status: Home / Self Care | Payer: 59 | Source: Ambulatory Visit | Attending: Gastroenterology | Admitting: Gastroenterology

## 2022-03-07 DIAGNOSIS — R109 Unspecified abdominal pain: Secondary | ICD-10-CM | POA: Insufficient documentation

## 2022-03-07 DIAGNOSIS — R1011 Right upper quadrant pain: Secondary | ICD-10-CM | POA: Diagnosis not present

## 2022-03-11 ENCOUNTER — Other Ambulatory Visit (HOSPITAL_COMMUNITY): Payer: Self-pay

## 2022-03-11 DIAGNOSIS — D500 Iron deficiency anemia secondary to blood loss (chronic): Secondary | ICD-10-CM | POA: Diagnosis not present

## 2022-03-11 DIAGNOSIS — R109 Unspecified abdominal pain: Secondary | ICD-10-CM | POA: Diagnosis not present

## 2022-03-12 LAB — COMPLETE METABOLIC PANEL WITH GFR
AG Ratio: 1.6 (calc) (ref 1.0–2.5)
ALT: 12 U/L (ref 6–29)
AST: 15 U/L (ref 10–30)
Albumin: 4.3 g/dL (ref 3.6–5.1)
Alkaline phosphatase (APISO): 83 U/L (ref 31–125)
BUN: 9 mg/dL (ref 7–25)
CO2: 20 mmol/L (ref 20–32)
Calcium: 9.3 mg/dL (ref 8.6–10.2)
Chloride: 104 mmol/L (ref 98–110)
Creat: 0.66 mg/dL (ref 0.50–0.97)
Globulin: 2.7 g/dL (calc) (ref 1.9–3.7)
Glucose, Bld: 88 mg/dL (ref 65–99)
Potassium: 4.1 mmol/L (ref 3.5–5.3)
Sodium: 137 mmol/L (ref 135–146)
Total Bilirubin: 0.4 mg/dL (ref 0.2–1.2)
Total Protein: 7 g/dL (ref 6.1–8.1)
eGFR: 119 mL/min/{1.73_m2} (ref >=60)

## 2022-03-12 LAB — IRON,TIBC AND FERRITIN PANEL
%SAT: 13 % (calc) — ABNORMAL LOW (ref 16–45)
Ferritin: 23 ng/mL (ref 16–154)
Iron: 45 ug/dL (ref 40–190)
TIBC: 354 mcg/dL (calc) (ref 250–450)

## 2022-03-18 NOTE — Telephone Encounter (Signed)
Referral sent 

## 2022-03-18 NOTE — Addendum Note (Signed)
Addended by: Armstead Peaks on: 03/18/2022 04:33 PM   Modules accepted: Orders

## 2022-03-18 NOTE — Telephone Encounter (Signed)
Mindy: please refer to Dr. Henreitta Leber (requesting her specifically) to discuss elective cholecystectomy.

## 2022-03-19 ENCOUNTER — Other Ambulatory Visit: Payer: Self-pay | Admitting: Family Medicine

## 2022-03-19 ENCOUNTER — Other Ambulatory Visit (HOSPITAL_COMMUNITY): Payer: Self-pay

## 2022-03-20 ENCOUNTER — Encounter: Payer: Self-pay | Admitting: Neurology

## 2022-03-20 ENCOUNTER — Other Ambulatory Visit: Payer: Self-pay | Admitting: Family Medicine

## 2022-03-20 ENCOUNTER — Other Ambulatory Visit (HOSPITAL_COMMUNITY): Payer: Self-pay

## 2022-03-20 ENCOUNTER — Ambulatory Visit (INDEPENDENT_AMBULATORY_CARE_PROVIDER_SITE_OTHER): Payer: 59 | Admitting: Neurology

## 2022-03-20 VITALS — BP 127/79 | HR 72 | Ht 70.0 in | Wt 260.6 lb

## 2022-03-20 DIAGNOSIS — F988 Other specified behavioral and emotional disorders with onset usually occurring in childhood and adolescence: Secondary | ICD-10-CM

## 2022-03-20 DIAGNOSIS — R0683 Snoring: Secondary | ICD-10-CM

## 2022-03-20 DIAGNOSIS — G4719 Other hypersomnia: Secondary | ICD-10-CM

## 2022-03-20 DIAGNOSIS — Z820 Family history of epilepsy and other diseases of the nervous system: Secondary | ICD-10-CM

## 2022-03-20 DIAGNOSIS — G478 Other sleep disorders: Secondary | ICD-10-CM | POA: Diagnosis not present

## 2022-03-20 DIAGNOSIS — Z9189 Other specified personal risk factors, not elsewhere classified: Secondary | ICD-10-CM | POA: Diagnosis not present

## 2022-03-20 DIAGNOSIS — J4520 Mild intermittent asthma, uncomplicated: Secondary | ICD-10-CM | POA: Diagnosis not present

## 2022-03-20 DIAGNOSIS — E669 Obesity, unspecified: Secondary | ICD-10-CM | POA: Diagnosis not present

## 2022-03-20 MED ORDER — METHYLPHENIDATE HCL ER (CD) 30 MG PO CPCR
30.0000 mg | ORAL_CAPSULE | ORAL | 0 refills | Status: DC
  Filled 2022-03-20: qty 30, 30d supply, fill #0

## 2022-03-20 NOTE — Progress Notes (Unsigned)
Subjective:    Patient ID: Cindy Hamilton is a 36 y.o. female.  HPI {Common ambulatory SmartLinks:[id number]}  Review of Systems  Neurological:        Pt here for sleep consult  Pt snore,fatigue Pt denies sleep study ,headaches,hypertension, CPAP machine   ESS:14 FSS:25    Objective:  Neurological Exam  Physical Exam  Assessment:   ***  Plan:   ***

## 2022-03-21 NOTE — Patient Instructions (Signed)
Thank you for choosing [facility] for your sleep related care! It was nice to meet you both today!   Here is what we discussed today:    Based on your symptoms and your exam I believe you are at risk for obstructive sleep apnea (aka OSA). We should proceed with a sleep study to determine whether you do or do not have OSA and how severe it is. Even, if you have mild OSA, I may want you to consider treatment with CPAP, as treatment of even borderline or mild sleep apnea can result and improvement of symptoms such as sleep disruption, daytime sleepiness, nighttime bathroom breaks, restless leg symptoms, improvement of headache syndromes, even improved mood disorder.   As explained, an attended sleep study (meaning you get to stay overnight in the sleep lab), lets us monitor sleep-related behaviors such as sleep talking and leg movements in sleep, in addition to monitoring for sleep apnea.  A home sleep test is a screening tool for sleep apnea diagnosis only, but unfortunately, does not help with any other sleep-related diagnoses.  Please remember, the long-term risks and ramifications of untreated moderate to severe obstructive sleep apnea may include (but are not limited to): increased risk for cardiovascular disease, including congestive heart failure, stroke, difficult to control hypertension, treatment resistant obesity, arrhythmias, especially irregular heartbeat commonly known as A. Fib. (atrial fibrillation); even type 2 diabetes has been linked to untreated OSA.   Other correlations that untreated obstructive sleep apnea include macular edema which is swelling of the retina in the eyes, droopy eyelid syndrome, and elevated hemoglobin and hematocrit levels (often referred to as polycythemia).  Sleep apnea can cause disruption of sleep and sleep deprivation in most cases, which, in turn, can cause recurrent headaches, problems with memory, mood, concentration, focus, and vigilance.  Most people with untreated sleep apnea report excessive daytime sleepiness, which can affect their ability to drive. Please do not drive or use heavy equipment or machinery, if you feel sleepy! Patients with sleep apnea can also develop difficulty initiating and maintaining sleep (aka insomnia).   Having sleep apnea may increase your risk for other sleep disorders, including involuntary behaviors sleep such as sleep terrors, sleep talking, sleepwalking.    Having sleep apnea can also increase your risk for restless leg syndrome and leg movements at night.   Please note that untreated obstructive sleep apnea may carry additional perioperative morbidity. Patients with significant obstructive sleep apnea (typically, in the moderate to severe degree) should receive, if possible, perioperative PAP (positive airway pressure) therapy and the surgeons and particularly the anesthesiologists should be informed of the diagnosis and the severity of the sleep disordered breathing.   We will call you or email you through MyChart with regards to your test results and plan a follow-up in sleep clinic accordingly. Most likely, you will hear from one of our nurses.   Our sleep lab administrative assistant will call you to schedule your sleep study and give you further instructions, regarding the check in process for the sleep study, arrival time, what to bring, when you can expect to leave after the study, etc., and to answer any other logistical questions you may have. If you don't hear back from her by about 2 weeks from now, please feel free to call her direct line at [phone] or you can call our general clinic number, or email us through My Chart.   

## 2022-04-04 DIAGNOSIS — F411 Generalized anxiety disorder: Secondary | ICD-10-CM | POA: Diagnosis not present

## 2022-04-07 IMAGING — CR DG CERVICAL SPINE COMPLETE 4+V
6 series · 6 of 6 positions shown · non-contrast
Comparison: None.

CLINICAL DATA: Chronic cervicalgia

EXAM:
CERVICAL SPINE - COMPLETE 4+ VIEW

[w lateral]
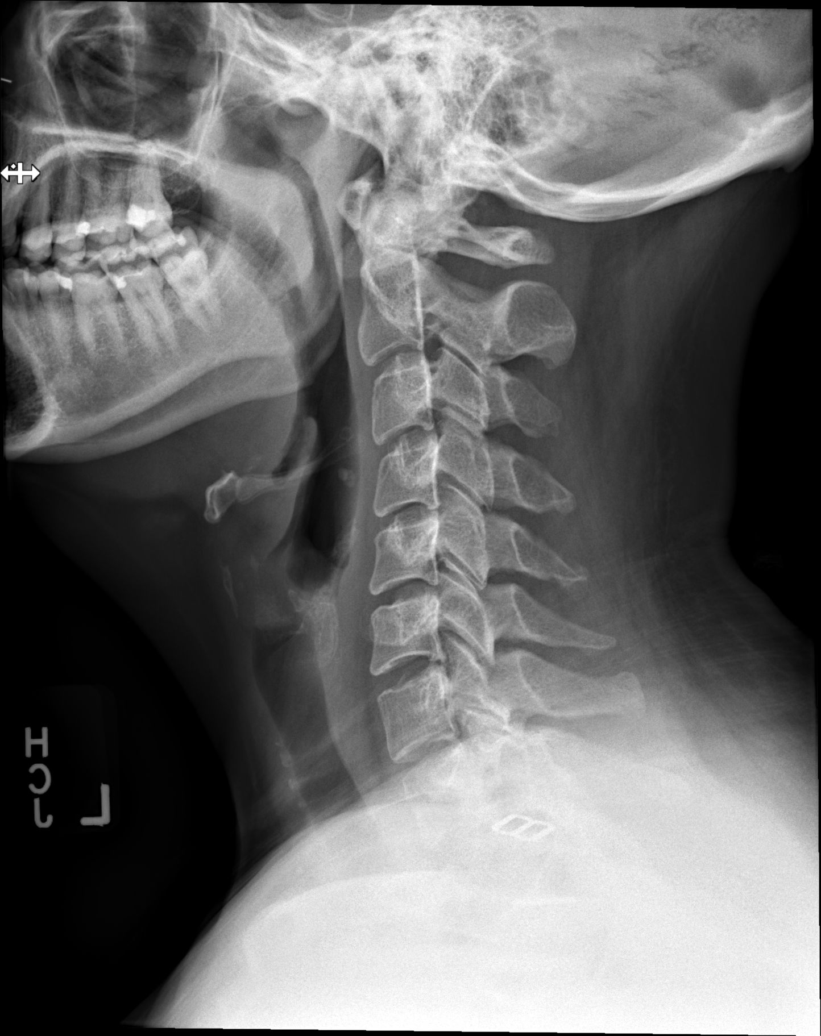

[w rpo (1 of 2)]
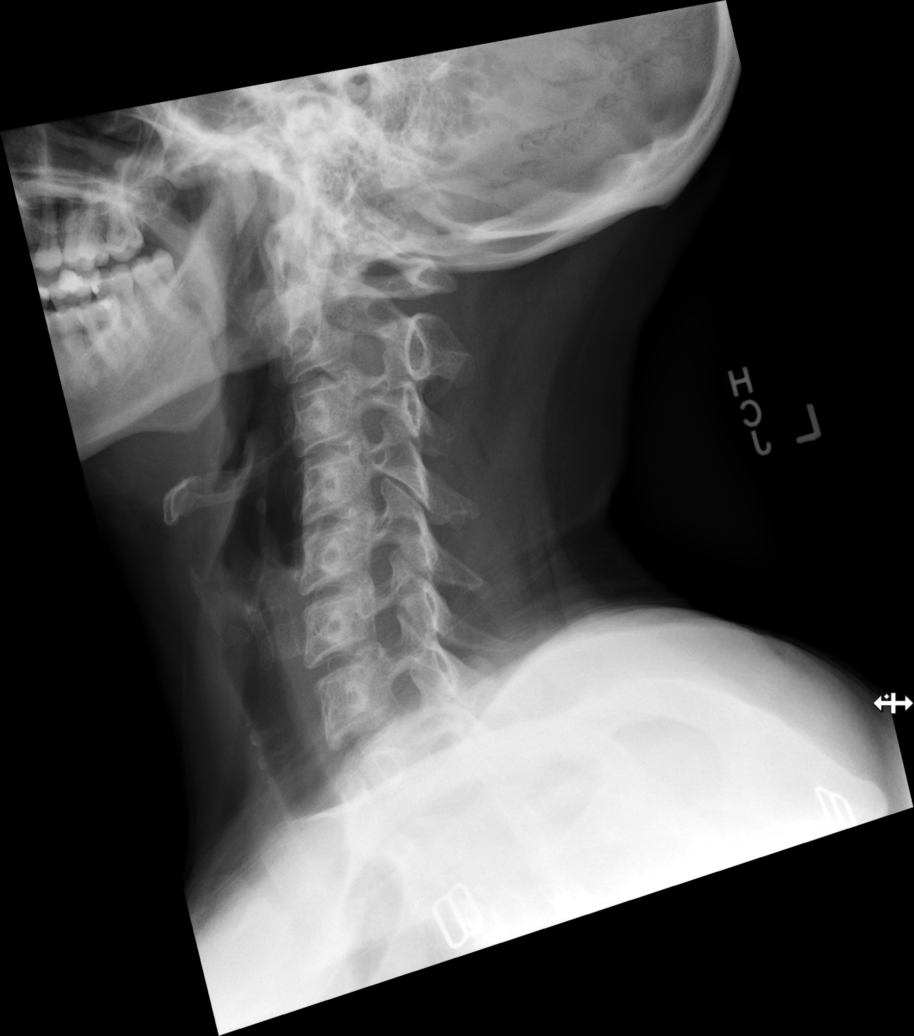

[w rpo (2 of 2)]
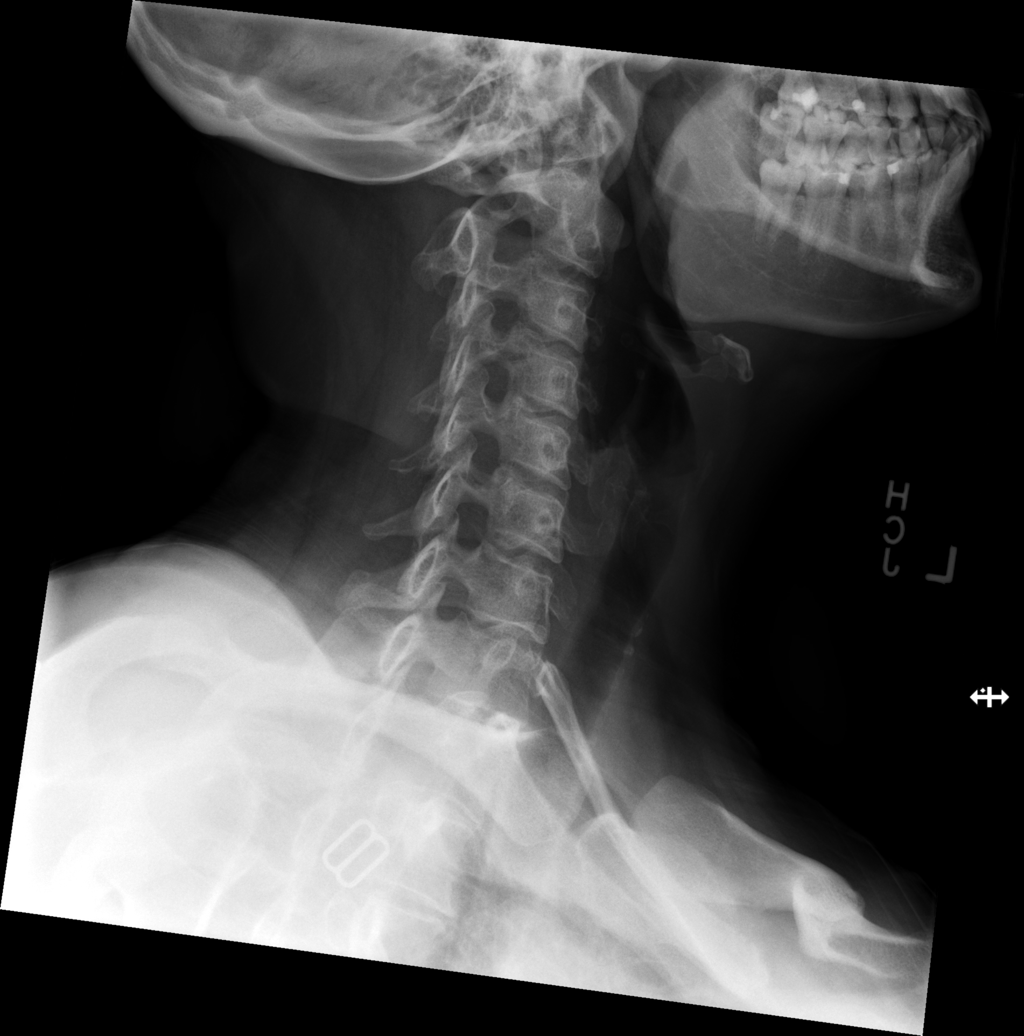

[w ap axial (1 of 2)]
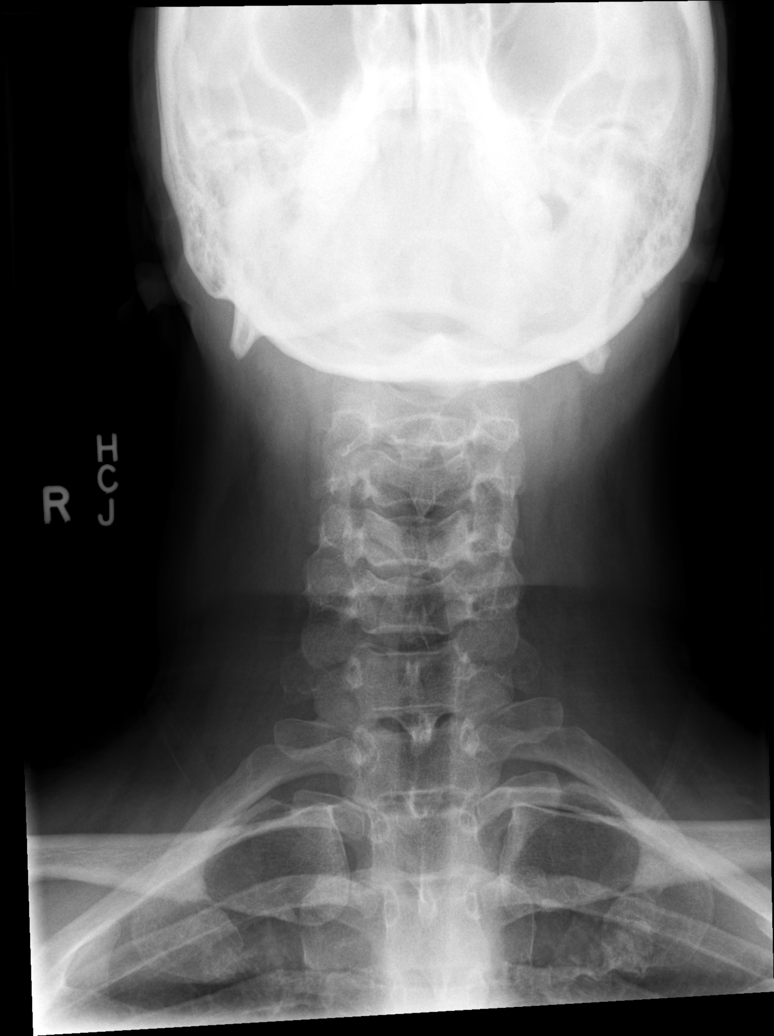

[w open mouth odontoid]
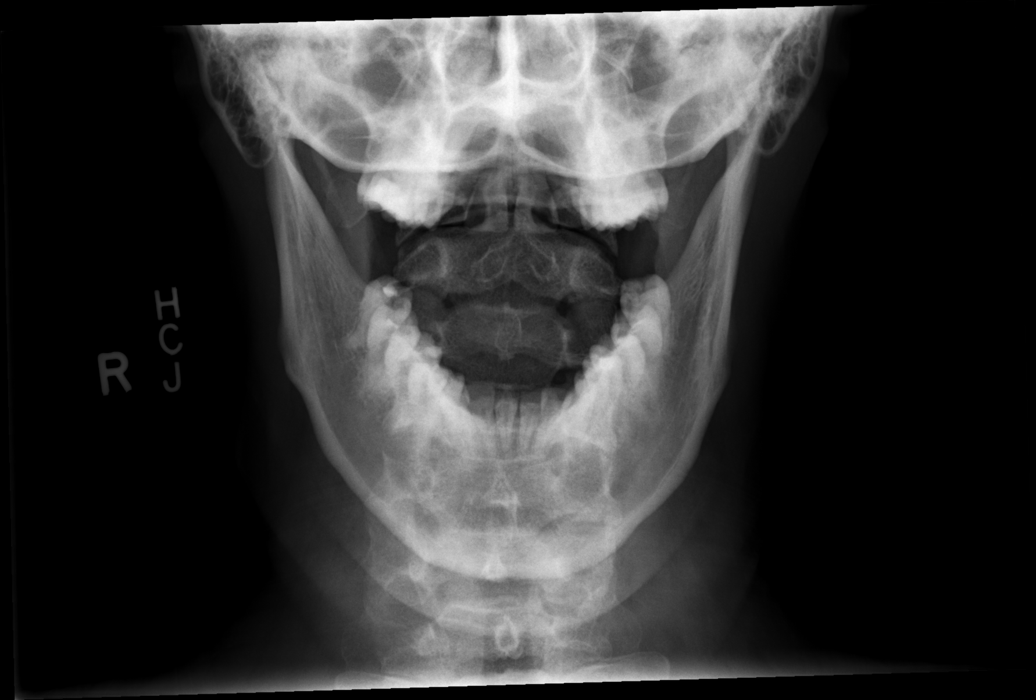

[w ap axial (2 of 2)]
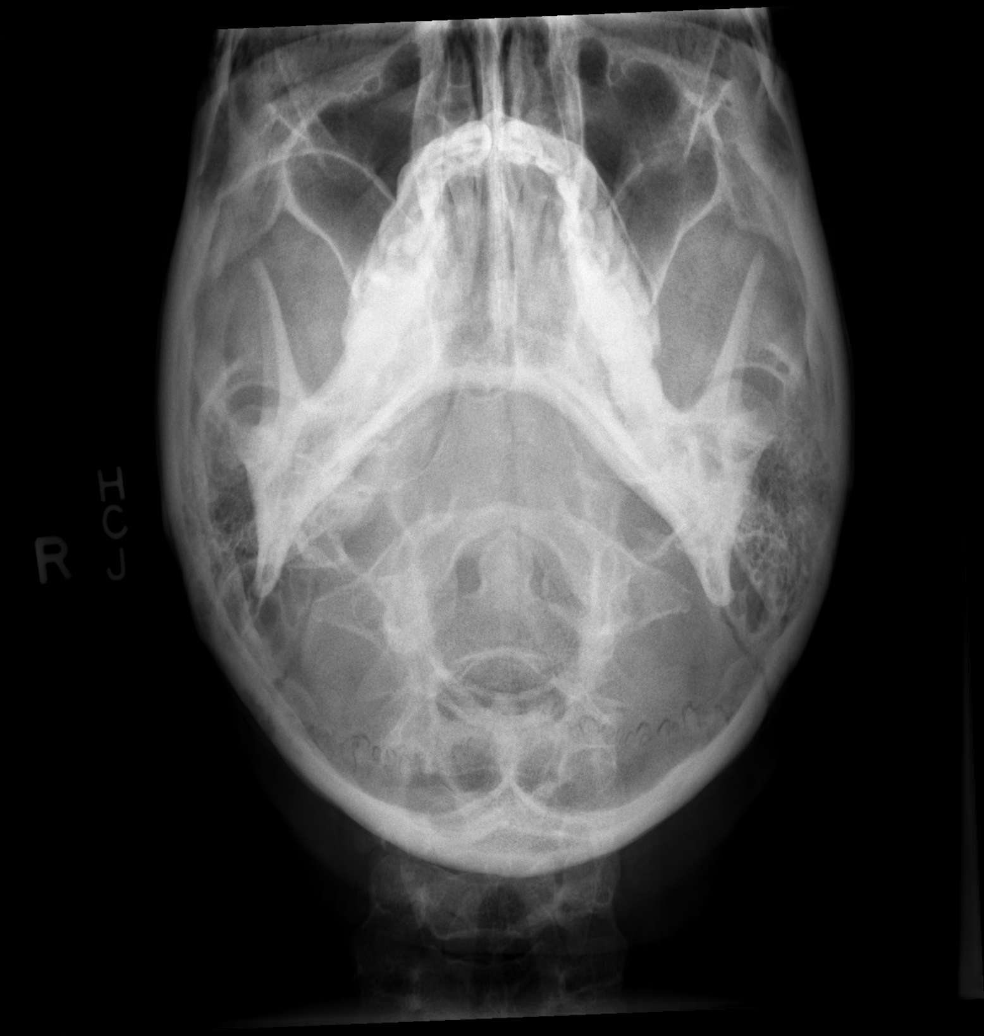

[6 of 6 positions shown; findings below may reference images not displayed]

FINDINGS: Frontal, lateral, open-mouth odontoid, and bilateral oblique views
were obtained. There is no fracture or spondylolisthesis.
Prevertebral soft tissues and predental space regions are normal.
The disc spaces appear unremarkable. There is no appreciable exit
foraminal narrowing on the oblique views. There is slight reversal
of lordotic curvature. Lung apices are clear.
IMPRESSION: No fracture or spondylolisthesis. No appreciable arthropathic
change. Slight reversal of lordotic curvature is likely indicative
of muscle spasm.

## 2022-04-11 ENCOUNTER — Ambulatory Visit (INDEPENDENT_AMBULATORY_CARE_PROVIDER_SITE_OTHER): Payer: 59 | Admitting: General Surgery

## 2022-04-11 ENCOUNTER — Encounter: Payer: Self-pay | Admitting: General Surgery

## 2022-04-11 VITALS — BP 118/76 | HR 73 | Temp 98.1°F | Resp 12 | Ht 70.0 in | Wt 259.0 lb

## 2022-04-11 DIAGNOSIS — K8020 Calculus of gallbladder without cholecystitis without obstruction: Secondary | ICD-10-CM | POA: Insufficient documentation

## 2022-04-11 NOTE — Progress Notes (Signed)
[facility] History and Physical  Reason for Referral:*** Referring Physician: ***  Chief Complaint   New Patient (Initial Visit)     Cindy Hamilton is a 36 y.o. female.  HPI: Cindy Hamilton is a known to me from work and has noted 2 episodes in July and August where she woke up in the middle of the night with extreme 7/10 pain in the epigastric upper abdominal region after eating heavy meals that did have some greasy /fatty items. She did not have any major nausea or vomiting. She felt tender on the right after the episodes. She says that she was seen by GI Lewie Loron to see if that was more reflux or gallbladder.  They   Past Medical History:  Diagnosis Date  . ADD (attention deficit disorder) 12/19/2021   Diagnosed by psychology  . Anemia   . Anxiety   . Asthma   . Depression   . GERD (gastroesophageal reflux disease) 12/24/2021   On famotidine 20 mg  . Heart murmur   . Herpes labialis without complication 12/24/2021   Uses intermittent valacyclovir  . Hx of cold sores 12/24/2021   Uses intermittent valacyclovir  . Iron deficiency anemia, unspecified 12/24/2021   Due to heavy cycles.  Takes ferrous sulfate 325 mg daily  . Reactive airway disease 12/19/2021   Worse during seasonal allergy peaks    Past Surgical History:  Procedure Laterality Date  . COLONOSCOPY     remote past in [place].    Family History  Problem Relation Age of Onset  . Osteopenia Father   . Prostate cancer Father   . Sleep apnea Father   . Colon polyps Brother        unknown if adenomas  . Cancer Paternal Aunt        Ovarian  . Cancer Maternal Grandmother        Non-Hodgkin's lymphoma  . Diabetes Maternal Grandfather   . Heart disease Maternal Grandfather   . Prostate cancer Paternal Grandfather   . Colon cancer Neg Hx     Social History   Tobacco Use  . Smoking status: Never  . Smokeless tobacco: Never  Vaping Use  . Vaping Use: Never used  Substance Use Topics   . Alcohol use: Never  . Drug use: Never    Medications: {medication reviewed/display:3041432} Allergies as of 04/11/2022   No Known Allergies      Medication List        Accurate as of April 11, 2022  9:12 AM. If you have any questions, ask your nurse or doctor.          albuterol 108 (90 Base) MCG/ACT inhaler Commonly known as: VENTOLIN HFA Inhale 2 puffs into the lungs every 6 (six) hours as needed for wheezing or shortness of breath.   cholecalciferol 25 MCG (1000 UNIT) tablet Commonly known as: VITAMIN D3 Take 1,000 Units by mouth daily.   famotidine 20 MG tablet Commonly known as: Pepcid Take 1 tablet (20 mg total) by mouth 2 (two) times daily.   ferrous sulfate 325 (65 FE) MG EC tablet Take 1 tablet (325 mg total) by mouth daily.   Flovent HFA 110 MCG/ACT inhaler Generic drug: fluticasone Use 2 puffs twice a day as directed to help control reactive airway   FLUoxetine 20 MG capsule Commonly known as: PROZAC Take 1 capsule by mouth daily.   methylphenidate 30 MG CR capsule Commonly known as: METADATE CD Take 1 capsule (30 mg total) by mouth  every morning.   methylphenidate 30 MG CR capsule Commonly known as: METADATE CD Take 1 capsule (30 mg total) by mouth every morning.   valACYclovir 1000 MG tablet Commonly known as: VALTREX Take 1 tablet by mouth twice daily What changed:  when to take this reasons to take this additional instructions         ROS:  {Review of Systems:30496}  Blood pressure 118/76, pulse 73, temperature 98.1 F (36.7 C), temperature source Oral, resp. rate 12, height 5\' 10"  (1.778 m), weight 259 lb (117.5 kg), SpO2 98 %. Physical Exam  Results: CLINICAL DATA:  Right upper quadrant pain for a year   EXAM: ULTRASOUND ABDOMEN LIMITED RIGHT UPPER QUADRANT   COMPARISON:  None Available.   FINDINGS: Gallbladder:   Numerous stones in significant sludge identified in the gallbladder without wall thickening,  pericholecystic fluid, or Murphy's sign.   Common bile duct:   Diameter: 5.1 mm   Liver:   Increased echogenicity throughout the liver. No focal mass. Portal vein is patent on color Doppler imaging with normal direction of blood flow towards the liver.   Other: None.   IMPRESSION: 1. Stones and sludge fill much of the gallbladder without wall thickening, pericholecystic fluid, or Murphy's sign. 2. The common bile duct is normal in caliber. 3. Increased echogenicity in the liver is identified, consistent with hepatic steatosis or nonspecific hepatocellular disease.     Electronically Signed   By: III M.D.   On: 03/07/2022 15:17       Assessment & Plan:  Cindy Hamilton is a 36 y.o. female with *** -*** -*** -Follow up ***  All questions were answered to the satisfaction of the patient and family***.  The risk and benefits of *** were discussed including but not limited to ***.  After careful consideration, Cindy Hamilton has decided to ***.    Rojelio Brenner 04/11/2022, 9:12 AM

## 2022-04-11 NOTE — Patient Instructions (Addendum)
Nutritarian Diet- Dr. Awilda Bill   Call when you are ready to get your gallbladder removed or with any questions or concerns. Cholelithiasis  Cholelithiasis is a disease in which gallstones form in the gallbladder. The gallbladder is an organ that stores bile. Bile is a fluid that helps to digest fats. Gallstones begin as small crystals and can slowly grow into stones. They may cause no symptoms until they block the gallbladder duct, or cystic duct, when the gallbladder tightens (contracts) after food is eaten. This can cause pain and is known as a gallbladder attack, or biliary colic. There are two main types of gallstones: Cholesterol stones. These are the most common type of gallstone. These stones are made of hardened cholesterol and are usually yellow-green in color. Cholesterol is a fat-like substance that is made in the liver. Pigment stones. These are dark in color and are made of a red-yellow substance, called bilirubin,that forms when hemoglobin from red blood cells breaks down. What are the causes? This condition may be caused by an imbalance in the different parts that make bile. This can happen if the bile: Has too much bilirubin. This can happen in certain blood diseases, such as sickle cell anemia. Has too much cholesterol. Does not have enough bile salts. These salts help the body absorb and digest fats. In some cases, this condition can also be caused by the gallbladder not emptying completely or often enough. This is common during pregnancy. What increases the risk? The following factors may make you more likely to develop this condition: Being female. Having multiple pregnancies. Health care providers sometimes advise removing diseased gallbladders before future pregnancies. Eating a diet that is heavy in fried foods, fat, and refined carbohydrates, such as white bread and white rice. Being obese. Being older than age 17. Using medicines that contain female hormones (estrogen)  for a long time. Losing weight quickly. Having a family history of gallstones. Having certain medical problems, such as: Diabetes mellitus. Cystic fibrosis. Crohn's disease. Cirrhosis or other long-term (chronic) liver disease. Certain blood diseases, such as sickle cell anemia or leukemia. What are the signs or symptoms? In many cases, having gallstones causes no symptoms. When you have gallstones but do not have symptoms, you have silent gallstones. If a gallstone blocks your bile duct, it can cause a gallbladder attack. The main symptom of a gallbladder attack is sudden pain in the upper right part of the abdomen. The pain: Usually comes at night or after eating. Can last for one hour or more. Can spread to your right shoulder, back, or chest. Can feel like indigestion. This is discomfort, burning, or fullness in your upper abdomen. If the bile duct is blocked for more than a few hours, it can cause an infection or inflammation of your gallbladder (cholecystitis), liver, or pancreas. This can cause: Nausea or vomiting. Bloating. Pain in your abdomen that lasts for 5 hours or longer. Tenderness in your upper abdomen, often in the upper right section and under your rib cage. Fever or chills. Skin or the white parts of your eyes turning yellow (jaundice). This usually happens when a stone has blocked bile from passing through the common bile duct. Dark urine or light-colored stools. How is this diagnosed? This condition may be diagnosed based on: A physical exam. Your medical history. Ultrasound. CT scan. MRI. You may also have other tests, including: Blood tests to check for signs of an infection or inflammation. Cholescintigraphy, or HIDA scan. This is a scan of your gallbladder  and bile ducts (biliary system) using non-harmful radioactive material and special cameras that can see the radioactive material. Endoscopic retrograde cholangiopancreatogram. This involves inserting a  small tube with a camera on the end (endoscope) through your mouth to look at bile ducts and check for blockages. How is this treated? Treatment for this condition depends on the severity of the condition. Silent gallstones do not need treatment. Treatment may be needed if a blockage causes a gallbladder attack or other symptoms. Treatment may include: Home care, if symptoms are not severe. During a simple gallbladder attack, stop eating and drinking for 12-24 hours (except for water and clear liquids). This helps to "cool down" your gallbladder. After 1 or 2 days, you can start to eat a diet of simple or clear foods, such as broths and crackers. You may also need medicines for pain or nausea or both. If you have cholecystitis and an infection, you will need antibiotics. A hospital stay, if needed for pain control or for cholecystitis with severe infection. Cholecystectomy, or surgery to remove your gallbladder. This is the most common treatment if all other treatments have not worked. Medicines to break up gallstones. These are most effective at treating small gallstones. Medicines may be used for up to 6-12 months. Endoscopic retrograde cholangiopancreatogram. A small basket can be attached to the endoscope and used to capture and remove gallstones, mainly those that are in the common bile duct. Follow these instructions at home: Medicines Take over-the-counter and prescription medicines only as told by your health care provider. If you were prescribed an antibiotic medicine, take it as told by your health care provider. Do not stop taking the antibiotic even if you start to feel better. Ask your health care provider if the medicine prescribed to you requires you to avoid driving or using machinery. Eating and drinking Drink enough fluid to keep your urine pale yellow. This is important during a gallbladder attack. Water and clear liquids are preferred. Follow a healthy diet. This  includes: Reducing fatty foods, such as fried food and foods high in cholesterol. Reducing refined carbohydrates, such as white bread and white rice. Eating more fiber. Aim for foods such as almonds, fruit, and beans. Alcohol use If you drink alcohol: Limit how much you use to: 0-1 drink a day for nonpregnant women. 0-2 drinks a day for men. Be aware of how much alcohol is in your drink. In the [place] one drink equals one 12 oz bottle of beer (355 mL), one 5 oz glass of wine (148 mL), or one 1 oz glass of hard liquor (44 mL). General instructions Do not use any products that contain nicotine or tobacco, such as cigarettes, e-cigarettes, and chewing tobacco. If you need help quitting, ask your health care provider. Maintain a healthy weight. Keep all follow-up visits as told by your health care provider. These may include consultations with a surgeon or specialist. This is important. Where to find more information [employer] of Diabetes and Digestive and Kidney Diseases: [employer] Contact a health care provider if: You think you have had a gallbladder attack. You have been diagnosed with silent gallstones and you develop pain in your abdomen or indigestion. You begin to have attacks more often. You have dark urine or light-colored stools. Get help right away if: You have pain from a gallbladder attack that lasts for more than 2 hours. You have pain in your abdomen that lasts for more than 5 hours or is getting worse. You have a fever  or chills. You have nausea and vomiting that do not go away. You develop jaundice. Summary Cholelithiasis is a disease in which gallstones form in the gallbladder. This condition may be caused by an imbalance in the different parts that make bile. This can happen if your bile has too much bilirubin or cholesterol, or does not have enough bile salts. Treatment for gallstones depends on the severity of the condition. Silent gallstones do not  need treatment. If gallstones cause a gallbladder attack or other symptoms, treatment usually involves not eating or drinking anything. Treatment may also include pain medicines and antibiotics, and it sometimes includes a hospital stay. Surgery to remove the gallbladder is common if all other treatments have not worked. This information is not intended to replace advice given to you by your health care provider. Make sure you discuss any questions you have with your health care provider. Document Revised: 06/07/2019 Document Reviewed: 06/07/2019 Elsevier Patient Education  2023 [employer]  Minimally Invasive Cholecystectomy Minimally invasive cholecystectomy is surgery to remove the gallbladder. The gallbladder is a pear-shaped organ that lies beneath the liver on the right side of the body. The gallbladder stores bile, which is a fluid that helps the body digest fats. Cholecystectomy is often done to treat inflammation (irritation and swelling) of the gallbladder (cholecystitis). This condition is usually caused by a buildup of gallstones (cholelithiasis) in the gallbladder or when the fluid in the gall bladder becomes stagnant because gallstones get stuck in the ducts (tubes) and block the flow of bile. This can result in inflammation and pain. In severe cases, emergency surgery may be required. This procedure is done through small incisions in the abdomen, instead of one large incision. It is also called laparoscopic surgery. A thin scope with a camera (laparoscope) is inserted through one incision. Then surgical instruments are inserted through the other incisions. In some cases, a minimally invasive surgery may need to be changed to a surgery that is done through a larger incision. This is called open surgery. Tell a health care provider about: Any allergies you have. All medicines you are taking, including vitamins, herbs, eye drops, creams, and over-the-counter medicines. Any problems you or  family members have had with anesthetic medicines. Any bleeding problems you have. Any surgeries you have had. Any medical conditions you have. Whether you are pregnant or may be pregnant. What are the risks? Generally, this is a safe procedure. However, problems may occur, including: Infection. Bleeding. Allergic reactions to medicines. Damage to nearby structures or organs. A gallstone remaining in the common bile duct. The common bile duct carries bile from the gallbladder to the small intestine. A bile leak from the liver or cystic duct after your gallbladder is removed. What happens before the procedure? Medicines Ask your health care provider about: Changing or stopping your regular medicines. This is especially important if you are taking diabetes medicines or blood thinners. Taking medicines such as aspirin and ibuprofen. These medicines can thin your blood. Do not take these medicines unless your health care provider tells you to take them. Taking over-the-counter medicines, vitamins, herbs, and supplements. General instructions If you will be going home right after the procedure, plan to have a responsible adult: Take you home from the hospital or clinic. You will not be allowed to drive. Care for you for the time you are told. Do not use any products that contain nicotine or tobacco for at least 4 weeks before the procedure. These products include cigarettes, chewing tobacco,  and vaping devices, such as e-cigarettes. If you need help quitting, ask your health care provider. Ask your health care provider: How your surgery site will be marked. What steps will be taken to help prevent infection. These may include: Removing hair at the surgery site. Washing skin with a germ-killing soap. Taking antibiotic medicine. What happens during the procedure?  An IV will be inserted into one of your veins. You will be given one or both of the following: A medicine to help you relax  (sedative). A medicine to make you fall asleep (general anesthetic). Your surgeon will make several small incisions in your abdomen. The laparoscope will be inserted through one of the small incisions. The camera on the laparoscope will send images to a monitor in the operating room. This lets your surgeon see inside your abdomen. A gas will be pumped into your abdomen. This will expand your abdomen to give the surgeon more room to perform the surgery. Other tools that are needed for the procedure will be inserted through the other incisions. The gallbladder will be removed through one of the incisions. Your common bile duct may be examined. If stones are found in the common bile duct, they may be removed. After your gallbladder has been removed, the incisions will be closed with stitches (sutures), staples, or skin glue. Your incisions will be covered with a bandage (dressing). The procedure may vary among health care providers and hospitals. What happens after the procedure? Your blood pressure, heart rate, breathing rate, and blood oxygen level will be monitored until you leave the hospital or clinic. You will be given medicines as needed to control your pain. You may have a drain placed in the incision. The drain will be removed a day or two after the procedure. Summary Minimally invasive cholecystectomy, also called laparoscopic cholecystectomy, is surgery to remove the gallbladder using small incisions. Tell your health care provider about all the medical conditions you have and all the medicines you are taking for those conditions. Before the procedure, follow instructions about when to stop eating and drinking and changing or stopping medicines. Plan to have a responsible adult care for you for the time you are told after you leave the hospital or clinic. This information is not intended to replace advice given to you by your health care provider. Make sure you discuss any questions you  have with your health care provider. Document Revised: 01/16/2021 Document Reviewed: 01/16/2021 Elsevier Patient Education  2023 [employer].

## 2022-04-16 ENCOUNTER — Encounter: Payer: Self-pay | Admitting: Family Medicine

## 2022-04-17 ENCOUNTER — Other Ambulatory Visit (HOSPITAL_COMMUNITY): Payer: Self-pay

## 2022-04-17 ENCOUNTER — Other Ambulatory Visit: Payer: Self-pay | Admitting: Family Medicine

## 2022-04-17 MED ORDER — METHYLPHENIDATE HCL ER (CD) 20 MG PO CPCR
20.0000 mg | ORAL_CAPSULE | ORAL | 0 refills | Status: DC
  Filled 2022-04-17 – 2022-04-18 (×2): qty 30, 30d supply, fill #0

## 2022-04-18 ENCOUNTER — Other Ambulatory Visit (HOSPITAL_COMMUNITY): Payer: Self-pay

## 2022-04-25 DIAGNOSIS — F411 Generalized anxiety disorder: Secondary | ICD-10-CM | POA: Diagnosis not present

## 2022-05-01 ENCOUNTER — Encounter: Payer: Self-pay | Admitting: Family Medicine

## 2022-05-01 ENCOUNTER — Ambulatory Visit: Payer: 59 | Admitting: Family Medicine

## 2022-05-01 VITALS — BP 112/76 | HR 76 | Temp 98.2°F | Wt 261.6 lb

## 2022-05-01 DIAGNOSIS — F988 Other specified behavioral and emotional disorders with onset usually occurring in childhood and adolescence: Secondary | ICD-10-CM | POA: Diagnosis not present

## 2022-05-01 MED ORDER — METHYLPHENIDATE HCL ER (CD) 20 MG PO CPCR
20.0000 mg | ORAL_CAPSULE | ORAL | 0 refills | Status: DC
  Filled 2022-05-01 – 2022-05-22 (×2): qty 30, 30d supply, fill #0

## 2022-05-01 MED ORDER — METHYLPHENIDATE HCL ER (CD) 20 MG PO CPCR
20.0000 mg | ORAL_CAPSULE | ORAL | 0 refills | Status: DC
  Filled 2022-05-01: qty 30, 30d supply, fill #0

## 2022-05-01 NOTE — Progress Notes (Signed)
   Subjective:    Patient ID: Cindy Hamilton, female    DOB: 06/26/1986, 36 y.o.   MRN: [id number]  HPI This patient has adult ADD. Takes medication responsibly. Medication does help the patient focus in be more functional. Patient relates that they are or not abusing the medication or misusing the medication. The patient understands that if they're having any negative side effects such as elevated high blood pressure severe headaches they would need stop the medication follow-up immediately. They also understand that the prescriptions are to last for 3 months then the patient will need to follow-up before having further prescriptions.  Patient compliance; Methylphenidate 30 mg   Does medication help patient function /attention better  a little bit; sees some small improvement   Side effects: none  Patient works as a PA at [employer] Job is very stressful very demanding Spends a lot of time with the job plus a lot of time in the evening.  Review of Systems     Objective:   Physical Exam  Full time was spent talking with patient regarding ADD      Assessment & Plan:  ADD We did discuss how balancing the right dosage would be important to minimize the risk of increased anxiety and anxiousness Also discussed the importance of balancing the job so that life work balance is reasonable

## 2022-05-02 ENCOUNTER — Other Ambulatory Visit (HOSPITAL_COMMUNITY): Payer: Self-pay

## 2022-05-05 ENCOUNTER — Encounter: Payer: Self-pay | Admitting: Neurology

## 2022-05-08 NOTE — Patient Instructions (Addendum)
Minimally Invasive Cholecystectomy, Care After The following information offers guidance on how to care for yourself after your procedure. Your health care provider may also give you more specific instructions. If you have problems or questions, contact your health care provider. What can I expect after the procedure? After the procedure, it is common to have: Pain at your incision sites. You will be given medicines to control this pain. Mild nausea or vomiting. Bloating and possible shoulder pain from the gas that was used during the procedure. Follow these instructions at home: Medicines Take over-the-counter and prescription medicines only as told by your health care provider. If you were prescribed an antibiotic medicine, take it as told by your health care provider. Do not stop using the antibiotic even if you start to feel better. Ask your health care provider if the medicine prescribed to you: Requires you to avoid driving or using machinery. Can cause constipation. You may need to take these actions to prevent or treat constipation: Drink enough fluid to keep your urine pale yellow. Take over-the-counter or prescription medicines. Eat foods that are high in fiber, such as beans, whole grains, and fresh fruits and vegetables. Limit foods that are high in fat and processed sugars, such as fried or sweet foods. Incision care  Follow instructions from your health care provider about how to take care of your incisions. Make sure you: Wash your hands with soap and water for at least 20 seconds before and after you change your bandage (dressing). If soap and water are not available, use hand sanitizer. Change your dressing as told by your health care provider. Leave stitches (sutures), skin glue, or adhesive strips in place. These skin closures may need to be in place for 2 weeks or longer. If adhesive strip edges start to loosen and curl up, you may trim the loose edges. Do not  remove adhesive strips completely unless your health care provider tells you to do that. Do not take baths, swim, or use a hot tub until your health care provider approves. Ask your health care provider if you may take showers. You may only be allowed to take sponge baths. Check your incision area every day for signs of infection. Check for: More redness, swelling, or pain. Fluid or blood. Warmth. Pus or a bad smell. Activity Rest as told by your health care provider. Do not do activities that require a lot of effort. Avoid sitting for a long time without moving. Get up to take short walks every 1-2 hours. This is important to improve blood flow and breathing. Ask for help if you feel weak or unsteady. Do not lift anything that is heavier than 10 lb (4.5 kg), or the limit that you are told, until your health care provider says that it is safe. Do not play contact sports until your health care provider approves. Do not return to work or school until your health care provider approves. Return to your normal activities as told by your health care provider. Ask your health care provider what activities are safe for you. General instructions If you were given a sedative during the procedure, it can affect you for several hours. Do not drive or operate machinery until your health care provider says that it is safe. Keep all follow-up visits. This is important. Contact a health care provider if: You develop a rash. You have more redness, swelling, or pain around your incisions. You have fluid or blood coming from your  incisions. Your incisions feel warm to the touch. You have pus or a bad smell coming from your incisions. You have a fever. One or more of your incisions breaks open. Get help right away if: You have trouble breathing. You have chest pain. You have more pain in your shoulders. You faint or feel dizzy when you stand. You have severe pain in your abdomen. You have nausea or  vomiting that lasts for more than one day. You have leg pain that is new or unusual, or if it is localized to one specific spot. These symptoms may represent a serious problem that is an emergency. Do not wait to see if the symptoms will go away. Get medical help right away. Call your local emergency services (911 in the [place] Do not drive yourself to the hospital. Summary After your procedure, it is common to have pain at the incision sites. You may also have nausea or bloating. Follow your health care provider's instructions about medicine, activity restrictions, and caring for your incision areas. Do not do activities that require a lot of effort. Contact a health care provider if you have a fever or other signs of infection, such as more redness, swelling, or pain around the incisions. Get help right away if you have chest pain, increasing pain in the shoulders, or trouble breathing. This information is not intended to replace advice given to you by your health care provider. Make sure you discuss any questions you have with your health care provider. Document Revised: 01/16/2021 Document Reviewed: 01/16/2021 Elsevier Patient Education  2023 [employer] General Anesthesia, Adult, Care After The following information offers guidance on how to care for yourself after your procedure. Your health care provider may also give you more specific instructions. If you have problems or questions, contact your health care provider. What can I expect after the procedure? After the procedure, it is common for people to: Have pain or discomfort at the IV site. Have nausea or vomiting. Have a sore throat or hoarseness. Have trouble concentrating. Feel cold or chills. Feel weak, sleepy, or tired (fatigue). Have soreness and body aches. These can affect parts of the body that were not involved in surgery. Follow these instructions at home: For the time period you were told by your health care  provider:  Rest. Do not participate in activities where you could fall or become injured. Do not drive or use machinery. Do not drink alcohol. Do not take sleeping pills or medicines that cause drowsiness. Do not make important decisions or sign legal documents. Do not take care of children on your own. General instructions Drink enough fluid to keep your urine pale yellow. If you have sleep apnea, surgery and certain medicines can increase your risk for breathing problems. Follow instructions from your health care provider about wearing your sleep device: Anytime you are sleeping, including during daytime naps. While taking prescription pain medicines, sleeping medicines, or medicines that make you drowsy. Return to your normal activities as told by your health care provider. Ask your health care provider what activities are safe for you. Take over-the-counter and prescription medicines only as told by your health care provider. Do not use any products that contain nicotine or tobacco. These products include cigarettes, chewing tobacco, and vaping devices, such as e-cigarettes. These can delay incision healing after surgery. If you need help quitting, ask your health care provider. Contact a health care provider if: You have nausea or vomiting that does not get better with medicine.  You vomit every time you eat or drink. You have pain that does not get better with medicine. You cannot urinate or have bloody urine. You develop a skin rash. You have a fever. Get help right away if: You have trouble breathing. You have chest pain. You vomit blood. These symptoms may be an emergency. Get help right away. Call 911. Do not wait to see if the symptoms will go away. Do not drive yourself to the hospital. Summary After the procedure, it is common to have a sore throat, hoarseness, nausea, vomiting, or to feel weak, sleepy, or fatigue. For the time period you were told by your health care  provider, do not drive or use machinery. Get help right away if you have difficulty breathing, have chest pain, or vomit blood. These symptoms may be an emergency. This information is not intended to replace advice given to you by your health care provider. Make sure you discuss any questions you have with your health care provider. Document Revised: 10/12/2021 Document Reviewed: 10/12/2021 Elsevier Patient Education  2023 [employer] How to Use Chlorhexidine Before Surgery Chlorhexidine gluconate (CHG) is a germ-killing (antiseptic) solution that is used to clean the skin. It can get rid of the bacteria that normally live on the skin and can keep them away for about 24 hours. To clean your skin with CHG, you may be given: A CHG solution to use in the shower or as part of a sponge bath. A prepackaged cloth that contains CHG. Cleaning your skin with CHG may help lower the risk for infection: While you are staying in the intensive care unit of the hospital. If you have a vascular access, such as a central line, to provide short-term or long-term access to your veins. If you have a catheter to drain urine from your bladder. If you are on a ventilator. A ventilator is a machine that helps you breathe by moving air in and out of your lungs. After surgery. What are the risks? Risks of using CHG include: A skin reaction. Hearing loss, if CHG gets in your ears and you have a perforated eardrum. Eye injury, if CHG gets in your eyes and is not rinsed out. The CHG product catching fire. Make sure that you avoid smoking and flames after applying CHG to your skin. Do not use CHG: If you have a chlorhexidine allergy or have previously reacted to chlorhexidine. On babies younger than 452 months of age. How to use CHG solution Use CHG only as told by your health care provider, and follow the instructions on the label. Use the full amount of CHG as directed. Usually, this is one bottle. During a  shower Follow these steps when using CHG solution during a shower (unless your health care provider gives you different instructions): Start the shower. Use your normal soap and shampoo to wash your face and hair. Turn off the shower or move out of the shower stream. Pour the CHG onto a clean washcloth. Do not use any type of brush or rough-edged sponge. Starting at your neck, lather your body down to your toes. Make sure you follow these instructions: If you will be having surgery, pay special attention to the part of your body where you will be having surgery. Scrub this area for at least 1 minute. Do not use CHG on your head or face. If the solution gets into your ears or eyes, rinse them well with water. Avoid your genital area. Avoid any areas of skin  that have broken skin, cuts, or scrapes. Scrub your back and under your arms. Make sure to wash skin folds. Let the lather sit on your skin for 1-2 minutes or as long as told by your health care provider. Thoroughly rinse your entire body in the shower. Make sure that all body creases and crevices are rinsed well. Dry off with a clean towel. Do not put any substances on your body afterward--such as powder, lotion, or perfume--unless you are told to do so by your health care provider. Only use lotions that are recommended by the manufacturer. Put on clean clothes or pajamas. If it is the night before your surgery, sleep in clean sheets.  During a sponge bath Follow these steps when using CHG solution during a sponge bath (unless your health care provider gives you different instructions): Use your normal soap and shampoo to wash your face and hair. Pour the CHG onto a clean washcloth. Starting at your neck, lather your body down to your toes. Make sure you follow these instructions: If you will be having surgery, pay special attention to the part of your body where you will be having surgery. Scrub this area for at least 1 minute. Do not use  CHG on your head or face. If the solution gets into your ears or eyes, rinse them well with water. Avoid your genital area. Avoid any areas of skin that have broken skin, cuts, or scrapes. Scrub your back and under your arms. Make sure to wash skin folds. Let the lather sit on your skin for 1-2 minutes or as long as told by your health care provider. Using a different clean, wet washcloth, thoroughly rinse your entire body. Make sure that all body creases and crevices are rinsed well. Dry off with a clean towel. Do not put any substances on your body afterward--such as powder, lotion, or perfume--unless you are told to do so by your health care provider. Only use lotions that are recommended by the manufacturer. Put on clean clothes or pajamas. If it is the night before your surgery, sleep in clean sheets. How to use CHG prepackaged cloths Only use CHG cloths as told by your health care provider, and follow the instructions on the label. Use the CHG cloth on clean, dry skin. Do not use the CHG cloth on your head or face unless your health care provider tells you to. When washing with the CHG cloth: Avoid your genital area. Avoid any areas of skin that have broken skin, cuts, or scrapes. Before surgery Follow these steps when using a CHG cloth to clean before surgery (unless your health care provider gives you different instructions): Using the CHG cloth, vigorously scrub the part of your body where you will be having surgery. Scrub using a back-and-forth motion for 3 minutes. The area on your body should be completely wet with CHG when you are done scrubbing. Do not rinse. Discard the cloth and let the area air-dry. Do not put any substances on the area afterward, such as powder, lotion, or perfume. Put on clean clothes or pajamas. If it is the night before your surgery, sleep in clean sheets.  For general bathing Follow these steps when using CHG cloths for general bathing (unless your  health care provider gives you different instructions). Use a separate CHG cloth for each area of your body. Make sure you wash between any folds of skin and between your fingers and toes. Wash your body in the following order, switching to  a new cloth after each step: The front of your neck, shoulders, and chest. Both of your arms, under your arms, and your hands. Your stomach and groin area, avoiding the genitals. Your right leg and foot. Your left leg and foot. The back of your neck, your back, and your buttocks. Do not rinse. Discard the cloth and let the area air-dry. Do not put any substances on your body afterward--such as powder, lotion, or perfume--unless you are told to do so by your health care provider. Only use lotions that are recommended by the manufacturer. Put on clean clothes or pajamas. Contact a health care provider if: Your skin gets irritated after scrubbing. You have questions about using your solution or cloth. You swallow any chlorhexidine. Call your local poison control center (2090449734 in the [place] Get help right away if:   Cindy Hamilton  05/08/2022     @[email or web address] @   Your procedure is scheduled on  05/15/2022.  Report to 05/17/2022 at  1040  A.M.   Call this number if you have problems the morning of surgery:  [phone]   Remember:  Do not eat or drink after midnight.  Use your inhalers before you come and bring your rescue inhaler with you.    Take these medicines the morning of surgery with A SIP OF WATER                                                   pepcid, prozac.    Do not wear jewelry, make-up or nail polish.  Do not wear lotions, powders, or perfumes, or deodorant.  Do not shave 48 hours prior to surgery.  Men may shave face and neck.  Do not bring valuables to the hospital.  [facility] is not responsible for any belongings or valuables.  Contacts, dentures or bridgework may not be worn into surgery.  Leave your  suitcase in the car.  After surgery it may be brought to your room.  For patients admitted to the hospital, discharge time will be determined by your treatment team.  Patients discharged the day of surgery will not be allowed to drive home and must have someone with them for 24 hours.    Special instructions:   DO NOT smoke tobacco or vape for 24 hours before your procedure.  Please read over the following fact sheets that you were given. Coughing and Deep Breathing, Surgical Site Infection Prevention, Anesthesia Post-op Instructions, and Care and Recovery After Surgery       Your eyes itch badly, or they become very red or swollen. Your skin itches badly and is red or swollen. Your hearing changes. You have trouble seeing. You have swelling or tingling in your mouth or throat. You have trouble breathing. These symptoms may represent a serious problem that is an emergency. Do not wait to see if the symptoms will go away. Get medical help right away. Call your local emergency services (911 in the [place] Do not drive yourself to the hospital. Summary Chlorhexidine gluconate (CHG) is a germ-killing (antiseptic) solution that is used to clean the skin. Cleaning your skin with CHG may help to lower your risk for infection. You may be given CHG to use for bathing. It may be in a bottle or in a prepackaged cloth to use on your skin. Carefully follow your health  care provider's instructions and the instructions on the product label. Do not use CHG if you have a chlorhexidine allergy. Contact your health care provider if your skin gets irritated after scrubbing. This information is not intended to replace advice given to you by your health care provider. Make sure you discuss any questions you have with your health care provider. Document Revised: 11/12/2021 Document Reviewed: 09/25/2020 Elsevier Patient Education  2023 [employer].

## 2022-05-13 ENCOUNTER — Other Ambulatory Visit (HOSPITAL_COMMUNITY): Payer: 59

## 2022-05-13 ENCOUNTER — Encounter (HOSPITAL_COMMUNITY)
Admission: RE | Admit: 2022-05-13 | Discharge: 2022-05-13 | Disposition: A | Discharge status: Home / Self Care | Payer: 59 | Source: Ambulatory Visit | Attending: General Surgery | Admitting: General Surgery

## 2022-05-13 DIAGNOSIS — I498 Other specified cardiac arrhythmias: Secondary | ICD-10-CM | POA: Insufficient documentation

## 2022-05-13 DIAGNOSIS — Z01818 Encounter for other preprocedural examination: Secondary | ICD-10-CM | POA: Insufficient documentation

## 2022-05-13 DIAGNOSIS — D509 Iron deficiency anemia, unspecified: Secondary | ICD-10-CM | POA: Diagnosis not present

## 2022-05-13 LAB — CBC WITH DIFFERENTIAL/PLATELET
Abs Immature Granulocytes: 0.02 10*3/uL (ref 0.00–0.07)
Basophils Absolute: 0 10*3/uL (ref 0.0–0.1)
Basophils Relative: 1 %
Eosinophils Absolute: 0.1 10*3/uL (ref 0.0–0.5)
Eosinophils Relative: 1 %
HCT: 35 % — ABNORMAL LOW (ref 36.0–46.0)
Hemoglobin: 11.4 g/dL — ABNORMAL LOW (ref 12.0–15.0)
Immature Granulocytes: 0 %
Lymphocytes Relative: 28 %
Lymphs Abs: 2.4 10*3/uL (ref 0.7–4.0)
MCH: 26.5 pg (ref 26.0–34.0)
MCHC: 32.6 g/dL (ref 30.0–36.0)
MCV: 81.2 fL (ref 80.0–100.0)
Monocytes Absolute: 0.6 10*3/uL (ref 0.1–1.0)
Monocytes Relative: 7 %
Neutro Abs: 5.3 10*3/uL (ref 1.7–7.7)
Neutrophils Relative %: 63 %
Platelets: 274 10*3/uL (ref 150–400)
RBC: 4.31 MIL/uL (ref 3.87–5.11)
RDW: 14.1 % (ref 11.5–15.5)
WBC: 8.5 10*3/uL (ref 4.0–10.5)
nRBC: 0 % (ref 0.0–0.2)

## 2022-05-13 LAB — POCT PREGNANCY, URINE: Preg Test, Ur: NEGATIVE

## 2022-05-14 ENCOUNTER — Telehealth: Payer: Self-pay | Admitting: Neurology

## 2022-05-14 NOTE — Telephone Encounter (Signed)
NPSG ( no auth req'd per [facility] on 04/29/22 at 11:57am central time) pt chose inlab  Patient is scheduled at [facility] for 06/13/22 at 9 pm.

## 2022-05-14 NOTE — Telephone Encounter (Signed)
NPSG ( no auth req'd per ALW on 04/29/22 at 11:57am central time) pt chose inlab.  Patient is scheduled at [facility] for 06/13/22 at 9 pm.  Mailed packet to the patient.

## 2022-05-15 ENCOUNTER — Ambulatory Visit (HOSPITAL_BASED_OUTPATIENT_CLINIC_OR_DEPARTMENT_OTHER)
Admission: RE | Admit: 2022-05-15 | Discharge: 2022-05-15 | Disposition: A | Discharge status: Home / Self Care | Payer: 59 | Source: Home / Self Care | Attending: General Surgery | Admitting: General Surgery

## 2022-05-15 ENCOUNTER — Encounter (HOSPITAL_COMMUNITY): Payer: Self-pay | Admitting: General Surgery

## 2022-05-15 ENCOUNTER — Encounter (HOSPITAL_COMMUNITY)
Admission: RE | Disposition: A | Discharge status: Home / Self Care | Payer: Self-pay | Source: Home / Self Care | Attending: General Surgery

## 2022-05-15 ENCOUNTER — Ambulatory Visit (HOSPITAL_COMMUNITY): Payer: 59 | Admitting: Anesthesiology

## 2022-05-15 ENCOUNTER — Other Ambulatory Visit: Payer: Self-pay

## 2022-05-15 ENCOUNTER — Ambulatory Visit (HOSPITAL_BASED_OUTPATIENT_CLINIC_OR_DEPARTMENT_OTHER): Payer: 59 | Admitting: Anesthesiology

## 2022-05-15 DIAGNOSIS — Z7951 Long term (current) use of inhaled steroids: Secondary | ICD-10-CM | POA: Diagnosis not present

## 2022-05-15 DIAGNOSIS — K8010 Calculus of gallbladder with chronic cholecystitis without obstruction: Secondary | ICD-10-CM | POA: Insufficient documentation

## 2022-05-15 DIAGNOSIS — K8020 Calculus of gallbladder without cholecystitis without obstruction: Secondary | ICD-10-CM

## 2022-05-15 DIAGNOSIS — R059 Cough, unspecified: Secondary | ICD-10-CM | POA: Diagnosis not present

## 2022-05-15 DIAGNOSIS — G8918 Other acute postprocedural pain: Secondary | ICD-10-CM | POA: Diagnosis not present

## 2022-05-15 DIAGNOSIS — Z6836 Body mass index (BMI) 36.0-36.9, adult: Secondary | ICD-10-CM | POA: Diagnosis not present

## 2022-05-15 DIAGNOSIS — J189 Pneumonia, unspecified organism: Secondary | ICD-10-CM | POA: Diagnosis not present

## 2022-05-15 DIAGNOSIS — J302 Other seasonal allergic rhinitis: Secondary | ICD-10-CM | POA: Diagnosis present

## 2022-05-15 DIAGNOSIS — Z8249 Family history of ischemic heart disease and other diseases of the circulatory system: Secondary | ICD-10-CM | POA: Diagnosis not present

## 2022-05-15 DIAGNOSIS — Z1152 Encounter for screening for COVID-19: Secondary | ICD-10-CM | POA: Diagnosis not present

## 2022-05-15 DIAGNOSIS — Z79899 Other long term (current) drug therapy: Secondary | ICD-10-CM | POA: Insufficient documentation

## 2022-05-15 DIAGNOSIS — Z8619 Personal history of other infectious and parasitic diseases: Secondary | ICD-10-CM | POA: Diagnosis not present

## 2022-05-15 DIAGNOSIS — R509 Fever, unspecified: Secondary | ICD-10-CM | POA: Diagnosis not present

## 2022-05-15 DIAGNOSIS — F909 Attention-deficit hyperactivity disorder, unspecified type: Secondary | ICD-10-CM | POA: Diagnosis present

## 2022-05-15 DIAGNOSIS — F32A Depression, unspecified: Secondary | ICD-10-CM | POA: Insufficient documentation

## 2022-05-15 DIAGNOSIS — J45909 Unspecified asthma, uncomplicated: Secondary | ICD-10-CM | POA: Insufficient documentation

## 2022-05-15 DIAGNOSIS — E669 Obesity, unspecified: Secondary | ICD-10-CM | POA: Diagnosis not present

## 2022-05-15 DIAGNOSIS — K828 Other specified diseases of gallbladder: Secondary | ICD-10-CM | POA: Diagnosis present

## 2022-05-15 DIAGNOSIS — D649 Anemia, unspecified: Secondary | ICD-10-CM | POA: Insufficient documentation

## 2022-05-15 DIAGNOSIS — Z888 Allergy status to other drugs, medicaments and biological substances status: Secondary | ICD-10-CM | POA: Diagnosis not present

## 2022-05-15 DIAGNOSIS — K219 Gastro-esophageal reflux disease without esophagitis: Secondary | ICD-10-CM | POA: Insufficient documentation

## 2022-05-15 DIAGNOSIS — J168 Pneumonia due to other specified infectious organisms: Secondary | ICD-10-CM | POA: Diagnosis not present

## 2022-05-15 DIAGNOSIS — G4730 Sleep apnea, unspecified: Secondary | ICD-10-CM | POA: Insufficient documentation

## 2022-05-15 DIAGNOSIS — E876 Hypokalemia: Secondary | ICD-10-CM | POA: Diagnosis not present

## 2022-05-15 DIAGNOSIS — F419 Anxiety disorder, unspecified: Secondary | ICD-10-CM | POA: Insufficient documentation

## 2022-05-15 DIAGNOSIS — K760 Fatty (change of) liver, not elsewhere classified: Secondary | ICD-10-CM | POA: Diagnosis present

## 2022-05-15 DIAGNOSIS — D509 Iron deficiency anemia, unspecified: Secondary | ICD-10-CM | POA: Diagnosis not present

## 2022-05-15 DIAGNOSIS — Z9049 Acquired absence of other specified parts of digestive tract: Secondary | ICD-10-CM | POA: Diagnosis not present

## 2022-05-15 DIAGNOSIS — B001 Herpesviral vesicular dermatitis: Secondary | ICD-10-CM | POA: Diagnosis present

## 2022-05-15 DIAGNOSIS — F339 Major depressive disorder, recurrent, unspecified: Secondary | ICD-10-CM | POA: Diagnosis not present

## 2022-05-15 DIAGNOSIS — K5900 Constipation, unspecified: Secondary | ICD-10-CM | POA: Diagnosis present

## 2022-05-15 HISTORY — PX: CHOLECYSTECTOMY: SHX55

## 2022-05-15 SURGERY — LAPAROSCOPIC CHOLECYSTECTOMY
Anesthesia: General

## 2022-05-15 MED ORDER — PROPOFOL 10 MG/ML IV BOLUS
INTRAVENOUS | Status: DC | PRN
  Administered 2022-05-15: 200 mg via INTRAVENOUS

## 2022-05-15 MED ORDER — BUPIVACAINE HCL (PF) 0.5 % IJ SOLN
INTRAMUSCULAR | Status: AC
  Filled 2022-05-15: qty 30

## 2022-05-15 MED ORDER — SODIUM CHLORIDE 0.9 % IR SOLN
Status: DC | PRN
  Administered 2022-05-15: 1000 mL

## 2022-05-15 MED ORDER — MIDAZOLAM HCL 2 MG/2ML IJ SOLN
INTRAMUSCULAR | Status: AC
  Filled 2022-05-15: qty 2

## 2022-05-15 MED ORDER — ORAL CARE MOUTH RINSE: 15.0000 mL | Freq: Once | OROMUCOSAL | Status: AC

## 2022-05-15 MED ORDER — FENTANYL CITRATE PF 50 MCG/ML IJ SOSY: 25.0000 ug | PREFILLED_SYRINGE | INTRAMUSCULAR | Status: DC | PRN

## 2022-05-15 MED ORDER — DEXAMETHASONE SODIUM PHOSPHATE 10 MG/ML IJ SOLN
INTRAMUSCULAR | Status: AC
  Filled 2022-05-15: qty 3

## 2022-05-15 MED ORDER — FENTANYL CITRATE (PF) 250 MCG/5ML IJ SOLN
INTRAMUSCULAR | Status: AC
  Filled 2022-05-15: qty 5

## 2022-05-15 MED ORDER — MIDAZOLAM HCL 5 MG/5ML IJ SOLN
INTRAMUSCULAR | Status: DC | PRN
  Administered 2022-05-15: 2 mg via INTRAVENOUS

## 2022-05-15 MED ORDER — ROCURONIUM BROMIDE 100 MG/10ML IV SOLN
INTRAVENOUS | Status: DC | PRN
  Administered 2022-05-15: 40 mg via INTRAVENOUS

## 2022-05-15 MED ORDER — BUPIVACAINE HCL 0.5 % IJ SOLN
INTRAMUSCULAR | Status: DC | PRN
  Administered 2022-05-15: 30 mL

## 2022-05-15 MED ORDER — CHLORHEXIDINE GLUCONATE CLOTH 2 % EX PADS: 6.0000 | MEDICATED_PAD | Freq: Once | CUTANEOUS | Status: DC

## 2022-05-15 MED ORDER — SCOPOLAMINE 1 MG/3DAYS TD PT72
MEDICATED_PATCH | TRANSDERMAL | Status: AC
  Filled 2022-05-15: qty 1

## 2022-05-15 MED ORDER — LACTATED RINGERS IV SOLN: INTRAVENOUS | Status: DC

## 2022-05-15 MED ORDER — LIDOCAINE HCL (CARDIAC) PF 100 MG/5ML IV SOSY
PREFILLED_SYRINGE | INTRAVENOUS | Status: DC | PRN
  Administered 2022-05-15: 100 mg via INTRAVENOUS

## 2022-05-15 MED ORDER — CHLORHEXIDINE GLUCONATE 0.12 % MT SOLN
15.0000 mL | Freq: Once | OROMUCOSAL | Status: AC
  Administered 2022-05-15: 15 mL via OROMUCOSAL

## 2022-05-15 MED ORDER — SODIUM CHLORIDE 0.9 % IV SOLN
INTRAVENOUS | Status: AC
  Filled 2022-05-15: qty 2

## 2022-05-15 MED ORDER — OXYCODONE HCL 5 MG PO TABS: 5.0000 mg | ORAL_TABLET | ORAL | 0 refills | Status: DC | PRN

## 2022-05-15 MED ORDER — ONDANSETRON HCL 4 MG PO TABS: 4.0000 mg | ORAL_TABLET | Freq: Three times a day (TID) | ORAL | 1 refills | Status: DC | PRN

## 2022-05-15 MED ORDER — ONDANSETRON HCL 4 MG/2ML IJ SOLN: 4.0000 mg | Freq: Once | INTRAMUSCULAR | Status: DC | PRN

## 2022-05-15 MED ORDER — HEMOSTATIC AGENTS (NO CHARGE) OPTIME
TOPICAL | Status: DC | PRN
  Administered 2022-05-15: 1 via TOPICAL

## 2022-05-15 MED ORDER — DEXAMETHASONE SODIUM PHOSPHATE 10 MG/ML IJ SOLN
INTRAMUSCULAR | Status: DC | PRN
  Administered 2022-05-15: 10 mg via INTRAVENOUS

## 2022-05-15 MED ORDER — SUCCINYLCHOLINE CHLORIDE 200 MG/10ML IV SOSY
PREFILLED_SYRINGE | INTRAVENOUS | Status: DC | PRN
  Administered 2022-05-15: 100 mg via INTRAVENOUS

## 2022-05-15 MED ORDER — OXYCODONE HCL 5 MG PO TABS
5.0000 mg | ORAL_TABLET | Freq: Once | ORAL | Status: AC | PRN
  Administered 2022-05-15: 5 mg via ORAL
  Filled 2022-05-15: qty 1

## 2022-05-15 MED ORDER — SCOPOLAMINE 1 MG/3DAYS TD PT72
1.0000 | MEDICATED_PATCH | Freq: Once | TRANSDERMAL | Status: DC
  Administered 2022-05-15: 1.5 mg via TRANSDERMAL

## 2022-05-15 MED ORDER — SODIUM CHLORIDE 0.9 % IV SOLN
2.0000 g | INTRAVENOUS | Status: AC
  Administered 2022-05-15: 2 g via INTRAVENOUS

## 2022-05-15 MED ORDER — OXYCODONE HCL 5 MG/5ML PO SOLN: 5.0000 mg | Freq: Once | ORAL | Status: AC | PRN

## 2022-05-15 MED ORDER — PROPOFOL 10 MG/ML IV BOLUS
INTRAVENOUS | Status: AC
  Filled 2022-05-15: qty 20

## 2022-05-15 MED ORDER — ONDANSETRON HCL 4 MG/2ML IJ SOLN
INTRAMUSCULAR | Status: DC | PRN
  Administered 2022-05-15: 4 mg via INTRAVENOUS

## 2022-05-15 MED ORDER — FENTANYL CITRATE (PF) 100 MCG/2ML IJ SOLN
INTRAMUSCULAR | Status: DC | PRN
  Administered 2022-05-15 (×4): 50 ug via INTRAVENOUS

## 2022-05-15 SURGICAL SUPPLY — 45 items
ADH SKN CLS APL DERMABOND .7 (GAUZE/BANDAGES/DRESSINGS) ×1
ADH SKN CLS LQ APL DERMABOND (GAUZE/BANDAGES/DRESSINGS) ×1
APL PRP STRL LF DISP 70% ISPRP (MISCELLANEOUS) ×1
APPLIER CLIP ROT 10 11.4 M/L (STAPLE) ×1
APR CLP MED LRG 11.4X10 (STAPLE) ×1
BLADE SURG 15 STRL LF DISP TIS (BLADE) ×1 IMPLANT
BLADE SURG 15 STRL SS (BLADE) ×1
CHLORAPREP W/TINT 26 (MISCELLANEOUS) ×1 IMPLANT
CLIP APPLIE ROT 10 11.4 M/L (STAPLE) ×1 IMPLANT
CLOTH BEACON ORANGE TIMEOUT ST (SAFETY) ×1 IMPLANT
COVER LIGHT HANDLE STERIS (MISCELLANEOUS) ×2 IMPLANT
DECANTER SPIKE VIAL GLASS SM (MISCELLANEOUS) ×1 IMPLANT
DERMABOND ADVANCED .7 DNX12 (GAUZE/BANDAGES/DRESSINGS) ×1 IMPLANT
DERMABOND ADVANCED .7 DNX6 (GAUZE/BANDAGES/DRESSINGS) IMPLANT
ELECT REM PT RETURN 9FT ADLT (ELECTROSURGICAL) ×1
ELECTRODE REM PT RTRN 9FT ADLT (ELECTROSURGICAL) ×1 IMPLANT
GAUZE 4X4 16PLY ~~LOC~~+RFID DBL (SPONGE) IMPLANT
GLOVE BIO SURGEON STRL SZ 6.5 (GLOVE) ×1 IMPLANT
GLOVE BIO SURGEON STRL SZ7.5 (GLOVE) IMPLANT
GLOVE BIOGEL PI IND STRL 6.5 (GLOVE) ×1 IMPLANT
GLOVE BIOGEL PI IND STRL 7.0 (GLOVE) ×2 IMPLANT
GLOVE ECLIPSE 6.0 STRL STRAW (GLOVE) IMPLANT
GLOVE ECLIPSE 6.5 STRL STRAW (GLOVE) IMPLANT
GOWN STRL REUS W/TWL LRG LVL3 (GOWN DISPOSABLE) ×3 IMPLANT
HEMOSTAT SNOW SURGICEL 2X4 (HEMOSTASIS) ×1 IMPLANT
INST SET LAPROSCOPIC AP (KITS) ×1 IMPLANT
KIT TURNOVER KIT A (KITS) ×1 IMPLANT
MANIFOLD NEPTUNE II (INSTRUMENTS) ×1 IMPLANT
NDL INSUFFLATION 14GA 120MM (NEEDLE) ×1 IMPLANT
NEEDLE INSUFFLATION 14GA 120MM (NEEDLE) ×1 IMPLANT
NS IRRIG 1000ML POUR BTL (IV SOLUTION) ×1 IMPLANT
PACK LAP CHOLE LZT030E (CUSTOM PROCEDURE TRAY) ×1 IMPLANT
PAD ARMBOARD 7.5X6 YLW CONV (MISCELLANEOUS) ×1 IMPLANT
SET BASIN LINEN APH (SET/KITS/TRAYS/PACK) ×1 IMPLANT
SET TUBE SMOKE EVAC HIGH FLOW (TUBING) ×1 IMPLANT
SLEEVE Z-THREAD 5X100MM (TROCAR) ×1 IMPLANT
SUT MNCRL AB 4-0 PS2 18 (SUTURE) ×2 IMPLANT
SUT VICRYL 0 UR6 27IN ABS (SUTURE) ×1 IMPLANT
SYS BAG RETRIEVAL 10MM (BASKET) ×1
SYSTEM BAG RETRIEVAL 10MM (BASKET) ×1 IMPLANT
TROCAR Z-THRD FIOS HNDL 11X100 (TROCAR) ×1 IMPLANT
TROCAR Z-THREAD FIOS 5X100MM (TROCAR) ×1 IMPLANT
TROCAR Z-THREAD SLEEVE 11X100 (TROCAR) ×1 IMPLANT
TUBE CONNECTING 12X1/4 (SUCTIONS) ×1 IMPLANT
WARMER LAPAROSCOPE (MISCELLANEOUS) ×1 IMPLANT

## 2022-05-15 NOTE — Anesthesia Preprocedure Evaluation (Signed)
Anesthesia Evaluation  Patient identified by MRN, date of birth, ID band Patient awake    Reviewed: Allergy & Precautions, H&P , NPO status , Patient's Chart, lab work & pertinent test results, reviewed documented beta blocker date and time   Airway Mallampati: II  TM Distance: >3 FB Neck ROM: full    Dental no notable dental hx.    Pulmonary asthma , sleep apnea ,    Pulmonary exam normal breath sounds clear to auscultation       Cardiovascular Exercise Tolerance: Good negative cardio ROS   Rhythm:regular Rate:Normal     Neuro/Psych PSYCHIATRIC DISORDERS Anxiety Depression negative neurological ROS     GI/Hepatic Neg liver ROS, GERD  Medicated,  Endo/Other  negative endocrine ROS  Renal/GU negative Renal ROS  negative genitourinary   Musculoskeletal   Abdominal   Peds  Hematology  (+) Blood dyscrasia, anemia ,   Anesthesia Other Findings   Reproductive/Obstetrics negative OB ROS                             Anesthesia Physical Anesthesia Plan  ASA: 2  Anesthesia Plan: General and General ETT   Post-op Pain Management:    Induction:   PONV Risk Score and Plan: Ondansetron and Scopolamine patch - Pre-op  Airway Management Planned:   Additional Equipment:   Intra-op Plan:   Post-operative Plan:   Informed Consent: I have reviewed the patients History and Physical, chart, labs and discussed the procedure including the risks, benefits and alternatives for the proposed anesthesia with the patient or authorized representative who has indicated his/her understanding and acceptance.     Dental Advisory Given  Plan Discussed with: CRNA  Anesthesia Plan Comments:         Anesthesia Quick Evaluation

## 2022-05-15 NOTE — Anesthesia Procedure Notes (Signed)
Procedure Name: Intubation Date/Time: 05/15/2022 9:32 AM  Performed by: Ollen Bowl, CRNAPre-anesthesia Checklist: Patient identified, Patient being monitored, Timeout performed, Emergency Drugs available and Suction available Patient Re-evaluated:Patient Re-evaluated prior to induction Oxygen Delivery Method: Circle system utilized Preoxygenation: Pre-oxygenation with 100% oxygen Induction Type: IV induction Ventilation: Mask ventilation without difficulty Laryngoscope Size: Mac and 3 Grade View: Grade II Tube type: Oral Tube size: 7.0 mm Number of attempts: 1 Airway Equipment and Method: Stylet Placement Confirmation: ETT inserted through vocal cords under direct vision, positive ETCO2 and breath sounds checked- equal and bilateral Secured at: 21 cm Tube secured with: Tape Dental Injury: Teeth and Oropharynx as per pre-operative assessment

## 2022-05-15 NOTE — Discharge Instructions (Addendum)
Discharge Laparoscopic Surgery Instructions:  Common Complaints: Right shoulder pain is common after laparoscopic surgery. This is secondary to the gas used in the surgery being trapped under the diaphragm.  Walk to help your body absorb the gas. This will improve in a few days. Pain at the port sites are common, especially the larger port sites. This will improve with time.  Some nausea is common and poor appetite. The main goal is to stay hydrated the first few days after surgery.   Diet/ Activity: Diet as tolerated. You may not have an appetite, but it is important to stay hydrated. Drink 64 ounces of water a day. Your appetite will return with time.  Shower per your regular routine daily.  Do not take hot showers. Take warm showers that are less than 10 minutes. Rest and listen to your body, but do not remain in bed all day.  Walk everyday for at least 15-20 minutes. Deep cough and move around every 1-2 hours in the first few days after surgery.  Do not lift > 10 lbs, perform excessive bending, pushing, pulling, squatting for 1-2 weeks after surgery.  Do not pick at the dermabond glue on your incision sites.  This glue film will remain in place for 1-2 weeks and will start to peel off.  Do not place lotions or balms on your incision unless instructed to specifically by Dr. Bridges.   Pain Expectations and Narcotics: -After surgery you will have pain associated with your incisions and this is normal. The pain is muscular and nerve pain, and will get better with time. -You are encouraged and expected to take non narcotic medications like tylenol and ibuprofen (when able) to treat pain as multiple modalities can aid with pain treatment. -Narcotics are only used when pain is severe or there is breakthrough pain. -You are not expected to have a pain score of 0 after surgery, as we cannot prevent pain. A pain score of 3-4 that allows you to be functional, move, walk, and tolerate some activity is  the goal. The pain will continue to improve over the days after surgery and is dependent on your surgery. -Due to [place] law, we are only able to give a certain amount of pain medication to treat post operative pain, and we only give additional narcotics on a patient by patient basis.  -For most laparoscopic surgery, studies have shown that the majority of patients only need 10-15 narcotic pills, and for open surgeries most patients only need 15-20.   -Having appropriate expectations of pain and knowledge of pain management with non narcotics is important as we do not want anyone to become addicted to narcotic pain medication.  -Using ice packs in the first 48 hours and heating pads after 48 hours, wearing an abdominal binder (when recommended), and using over the counter medications are all ways to help with pain management.   -Simple acts like meditation and mindfulness practices after surgery can also help with pain control and research has proven the benefit of these practices.  Medication: Take tylenol and ibuprofen as needed for pain control, alternating every 4-6 hours.  Example:  Tylenol 1000mg @ 6am, 12noon, 6pm, 12midnight (Do not exceed 4000mg of tylenol a day). Ibuprofen 800mg @ 9am, 3pm, 9pm, 3am (Do not exceed 3600mg of ibuprofen a day).  Take Roxicodone for breakthrough pain every 4 hours.  Take Colace for constipation related to narcotic pain medication. If you do not have a bowel movement in 2 days, take Miralax   over the counter.  Drink plenty of water to also prevent constipation.   Contact Information: If you have questions or concerns, please call our office, [phone], Monday- Thursday 8AM-5PM and Friday 8AM-12Noon.  If it is after hours or on the weekend, please call [facility], [phone], [phone], and ask to speak to the surgeon on call for Dr. Constance Haw at [facility].        Remove Nausea Patch behind ear on Saturday. Wash hands after removal

## 2022-05-15 NOTE — Transfer of Care (Signed)
Immediate Anesthesia Transfer of Care Note  Patient: Cindy Hamilton  Procedure(s) Performed: LAPAROSCOPIC CHOLECYSTECTOMY  Patient Location: PACU  Anesthesia Type:General  Level of Consciousness: awake  Airway & Oxygen Therapy: Patient Spontanous Breathing  Post-op Assessment: Report given to RN  Post vital signs: Reviewed and stable  Last Vitals:  Vitals Value Taken Time  BP 161/99 05/15/22 1045  Temp 36.7 C 05/15/22 1033  Pulse 79 05/15/22 1056  Resp 16 05/15/22 1056  SpO2 99 % 05/15/22 1056  Vitals shown include unvalidated device data.  Last Pain:  Vitals:   05/15/22 1045  TempSrc:   PainSc: 2       Patients Stated Pain Goal: 7 (16/10/96 0454)  Complications: No notable events documented.

## 2022-05-15 NOTE — Op Note (Signed)
Operative Note   Preoperative Diagnosis: Symptomatic cholelithiasis   Postoperative Diagnosis: Same   Procedure(s) Performed: Laparoscopic cholecystectomy   Surgeon: Ria Comment C. Constance Haw, MD   Assistants: No qualified resident was available   Anesthesia: General endotracheal   Anesthesiologist: Louann Sjogren, MD    Specimens: Gallbladder    Estimated Blood Loss: Minimal    Blood Replacement: None    Complications: None    Operative Findings: Distended gallbladder    Procedure: The patient was taken to the operating room and placed supine. General endotracheal anesthesia was induced. Intravenous antibiotics were administered per protocol. An orogastric tube positioned to decompress the stomach. The abdomen was prepared and draped in the usual sterile fashion.    A supraumbilical incision was made and a Veress technique was utilized to achieve pneumoperitoneum to 15 mmHg with carbon dioxide. A 11 mm optiview port was placed through the supraumbilical region, and a 10 mm 0-degree operative laparoscope was introduced. The area underlying the trocar and Veress needle were inspected and without evidence of injury.  Remaining trocars were placed under direct vision. Two 5 mm ports were placed in the right abdomen, between the anterior axillary and midclavicular line.  A final 11 mm port was placed through the mid-epigastrium, near the falciform ligament.    The gallbladder fundus was elevated cephalad and the infundibulum was retracted to the patient's right. The gallbladder/cystic duct junction was skeletonized. The cystic artery noted in the triangle of Calot and was also skeletonized.  We then continued liberal medial and lateral dissection until the critical view of safety was achieved.    The cystic duct and cystic artery were doubly clipped and divided. The gallbladder was then dissected from the liver bed with electrocautery. The specimen was placed in an Endopouch and was retrieved  through the epigastric site.   Final inspection revealed acceptable hemostasis. Surgical SNOW was placed in the gallbladder bed.  Trocars were removed and pneumoperitoneum was released.  0 Vicryl fascial sutures were used to close the epigastric and umbilical port sites. Skin incisions were closed with 4-0 Monocryl subcuticular sutures and Dermabond. The patient was awakened from anesthesia and extubated without complication.    Curlene Labrum, MD [facility] [address] [place], [place] [address] [phone] (office)

## 2022-05-15 NOTE — H&P (Signed)
Reason for Referral: Gallstones  Referring Physician: Self/ Roseanne Kaufman, NP   Chief Complaint   New Patient (Initial Visit)        Cindy Hamilton is a 36 y.o. female.  HPI: Cindy Hamilton is a known to me from work and has noted 2 episodes in July and August where she woke up in the middle of the night with extreme 7/10 pain in the epigastric upper abdominal region after eating heavy meals that did have some greasy /fatty items. She did not have any major nausea or vomiting. She felt tender on the right after the episodes. She says that she was seen by GI Roseanne Kaufman to see if that was more reflux or gallbladder.  They opted to hold on EGD right now and have me evaluate her for the gallbladder. She says she changed her diet and has been doing better from a symptom standpoint.        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  Takes ferrous sulfate 325 mg daily   Reactive airway disease 12/19/2021    Worse during seasonal allergy peaks           Past Surgical History:  Procedure Laterality Date   COLONOSCOPY        remote past in [place].           Family History  Problem Relation Age of Onset   Osteopenia Father     Prostate cancer Father     Sleep apnea Father     Colon polyps Brother          unknown if adenomas   Cancer Paternal Aunt          Ovarian   Cancer Maternal Grandmother          Non-Hodgkin's lymphoma   Diabetes Maternal Grandfather     Heart disease Maternal Grandfather     Prostate cancer Paternal Grandfather     Colon cancer Neg  Hx        Social History        Tobacco Use   Smoking status: Never   Smokeless tobacco: Never  Vaping Use   Vaping Use: Never used  Substance Use Topics   Alcohol use: Never   Drug use: Never      Medications: I have reviewed the patient's current medications. Allergies as of 04/11/2022   No Known Allergies         Medication List           Accurate as of April 11, 2022  9:12 AM. If you have any questions, ask your nurse or doctor.              albuterol 108 (90 Base) MCG/ACT inhaler Commonly known as: VENTOLIN HFA Inhale 2 puffs into the lungs every 6 (six) hours as needed for wheezing or shortness of breath.    cholecalciferol 25 MCG (1000 UNIT) tablet Commonly known as: VITAMIN D3 Take 1,000 Units  by mouth daily.    famotidine 20 MG tablet Commonly known as: Pepcid Take 1 tablet (20 mg total) by mouth 2 (two) times daily.    ferrous sulfate 325 (65 FE) MG EC tablet Take 1 tablet (325 mg total) by mouth daily.    Flovent HFA 110 MCG/ACT inhaler Generic drug: fluticasone Use 2 puffs twice a day as directed to help control reactive airway    FLUoxetine 20 MG capsule Commonly known as: PROZAC Take 1 capsule by mouth daily.    methylphenidate 30 MG CR capsule Commonly known as: METADATE CD Take 1 capsule (30 mg total) by mouth every morning.    methylphenidate 30 MG CR capsule Commonly known as: METADATE CD Take 1 capsule (30 mg total) by mouth every morning.    valACYclovir 1000 MG tablet Commonly known as: VALTREX Take 1 tablet by mouth twice daily What changed:  when to take this reasons to take this additional instructions               ROS:  A comprehensive review of systems was negative except for: Respiratory: positive for asthma Gastrointestinal: positive for abdominal pain and reflux symptoms Musculoskeletal: positive for neck pain   Blood pressure 118/76, pulse 73, temperature 98.1 F (36.7 C), temperature source Oral,  resp. rate 12, height 5\' 10"  (1.778 m), weight 259 lb (117.5 kg), SpO2 98 %. Physical Exam Vitals reviewed.  Constitutional:      Appearance: Normal appearance.  HENT:     Head: Normocephalic.     Nose: Nose normal.  Eyes:     Extraocular Movements: Extraocular movements intact.  Cardiovascular:     Rate and Rhythm: Normal rate and regular rhythm.  Pulmonary:     Effort: Pulmonary effort is normal.     Breath sounds: Normal breath sounds.  Abdominal:     General: There is no distension.     Palpations: Abdomen is soft.     Tenderness: There is no abdominal tenderness.  Musculoskeletal:        General: Normal range of motion.     Cervical back: Normal range of motion.  Skin:    General: Skin is warm.  Neurological:     General: No focal deficit present.     Mental Status: She is alert and oriented to person, place, and time.  Psychiatric:        Mood and Affect: Mood normal.        Behavior: Behavior normal.        Results: CLINICAL DATA:  Right upper quadrant pain for a year   EXAM: ULTRASOUND ABDOMEN LIMITED RIGHT UPPER QUADRANT   COMPARISON:  None Available.   FINDINGS: Gallbladder:   Numerous stones in significant sludge identified in the gallbladder without wall thickening, pericholecystic fluid, or Murphy's sign.   Common bile duct:   Diameter: 5.1 mm   Liver:   Increased echogenicity throughout the liver. No focal mass. Portal vein is patent on color Doppler imaging with normal direction of blood flow towards the liver.   Other: None.   IMPRESSION: 1. Stones and sludge fill much of the gallbladder without wall thickening, pericholecystic fluid, or Murphy's sign. 2. The common bile duct is normal in caliber. 3. Increased echogenicity in the liver is identified, consistent with hepatic steatosis or nonspecific hepatocellular disease.     Electronically Signed   By: III M.D.   On: 03/07/2022 15:17          Assessment &  Plan:   [name] is a 36 y.o. female with gallstones that are becoming symptomatic.  She is able to control this at this time. Discussed the stones and the risk that can come from having stones including cholecystitis, choledocholithiasis, pancreatitis. Discussed reasons to get  the cholecystectomy if her pain worsens.    PLAN: I counseled the patient about the indication, risks and benefits of laparoscopic cholecystectomy.  She understands there is a very small chance for bleeding, infection, injury to normal structures (including common bile duct), conversion to open surgery, persistent symptoms, evolution of postcholecystectomy diarrhea, need for secondary interventions, anesthesia reaction, cardiopulmonary issues and other risks not specifically detailed here. I described the expected recovery, the plan for follow-up and the restrictions during the recovery phase.  All questions were answered.   She is going to hold for now on the surgery and see how she feels. She will call once she is ready to get the gallbladder removed.   I can update her H&P the day of surgery if there have been no major medical changes.        Lucretia Roers 04/11/2022, 9:12 AM

## 2022-05-15 NOTE — Progress Notes (Signed)
[facility]  Updated patient's husband. Rx to [facility] on [address]. Will do post op phone call 11/1.  Call with issues.   Curlene Labrum, MD [facility] [address] [place], [place] [address] [phone] (office)

## 2022-05-15 NOTE — Interval H&P Note (Signed)
History and Physical Interval Note:  05/15/2022 8:32 AM  Cindy Hamilton  has presented today for surgery, with the diagnosis of CHOLELITHIASIS.  The various methods of treatment have been discussed with the patient and family. After consideration of risks, benefits and other options for treatment, the patient has consented to  Procedure(s) with comments: LAPAROSCOPIC CHOLECYSTECTOMY (N/A) - pt knows to arrive at 7:00 as a surgical intervention.  The patient's history has been reviewed, patient examined, no change in status, stable for surgery.  I have reviewed the patient's chart and labs.  Questions were answered to the patient's satisfaction.     Virl Cagey

## 2022-05-16 LAB — SURGICAL PATHOLOGY

## 2022-05-16 NOTE — Anesthesia Postprocedure Evaluation (Signed)
Anesthesia Post Note  Patient: Cindy Hamilton  Procedure(s) Performed: LAPAROSCOPIC CHOLECYSTECTOMY  Patient location during evaluation: Phase II Anesthesia Type: General Level of consciousness: awake Pain management: pain level controlled Vital Signs Assessment: post-procedure vital signs reviewed and stable Respiratory status: spontaneous breathing and respiratory function stable Cardiovascular status: blood pressure returned to baseline and stable Postop Assessment: no headache and no apparent nausea or vomiting Anesthetic complications: no Comments: Late entry   No notable events documented.   Last Vitals:  Vitals:   05/15/22 1145 05/15/22 1157  BP: (!) 129/92 123/80  Pulse: 65 64  Resp: 17 14  Temp:  36.7 C  SpO2: 93% 99%    Last Pain:  Vitals:   05/15/22 1157  TempSrc: Oral  PainSc: [place]

## 2022-05-17 ENCOUNTER — Inpatient Hospital Stay (HOSPITAL_COMMUNITY)
Admission: EM | Admit: 2022-05-17 | Discharge: 2022-05-18 | DRG: 417 | Disposition: A | Discharge status: Home / Self Care | Payer: 59 | Attending: Family Medicine | Admitting: Family Medicine

## 2022-05-17 ENCOUNTER — Other Ambulatory Visit: Payer: Self-pay

## 2022-05-17 ENCOUNTER — Emergency Department (HOSPITAL_COMMUNITY): Payer: 59

## 2022-05-17 ENCOUNTER — Encounter (HOSPITAL_COMMUNITY): Payer: Self-pay | Admitting: General Surgery

## 2022-05-17 DIAGNOSIS — Z8619 Personal history of other infectious and parasitic diseases: Secondary | ICD-10-CM | POA: Diagnosis not present

## 2022-05-17 DIAGNOSIS — F32A Depression, unspecified: Secondary | ICD-10-CM | POA: Diagnosis present

## 2022-05-17 DIAGNOSIS — K828 Other specified diseases of gallbladder: Secondary | ICD-10-CM | POA: Diagnosis present

## 2022-05-17 DIAGNOSIS — J189 Pneumonia, unspecified organism: Secondary | ICD-10-CM | POA: Diagnosis not present

## 2022-05-17 DIAGNOSIS — Z8249 Family history of ischemic heart disease and other diseases of the circulatory system: Secondary | ICD-10-CM

## 2022-05-17 DIAGNOSIS — Z888 Allergy status to other drugs, medicaments and biological substances status: Secondary | ICD-10-CM

## 2022-05-17 DIAGNOSIS — B001 Herpesviral vesicular dermatitis: Secondary | ICD-10-CM | POA: Diagnosis present

## 2022-05-17 DIAGNOSIS — Z6836 Body mass index (BMI) 36.0-36.9, adult: Secondary | ICD-10-CM | POA: Diagnosis not present

## 2022-05-17 DIAGNOSIS — F339 Major depressive disorder, recurrent, unspecified: Secondary | ICD-10-CM | POA: Diagnosis not present

## 2022-05-17 DIAGNOSIS — Z79899 Other long term (current) drug therapy: Secondary | ICD-10-CM | POA: Diagnosis not present

## 2022-05-17 DIAGNOSIS — D509 Iron deficiency anemia, unspecified: Secondary | ICD-10-CM | POA: Diagnosis not present

## 2022-05-17 DIAGNOSIS — E876 Hypokalemia: Secondary | ICD-10-CM | POA: Diagnosis not present

## 2022-05-17 DIAGNOSIS — K219 Gastro-esophageal reflux disease without esophagitis: Secondary | ICD-10-CM | POA: Diagnosis present

## 2022-05-17 DIAGNOSIS — F419 Anxiety disorder, unspecified: Secondary | ICD-10-CM | POA: Diagnosis present

## 2022-05-17 DIAGNOSIS — Z1152 Encounter for screening for COVID-19: Secondary | ICD-10-CM | POA: Diagnosis not present

## 2022-05-17 DIAGNOSIS — Z7951 Long term (current) use of inhaled steroids: Secondary | ICD-10-CM | POA: Diagnosis not present

## 2022-05-17 DIAGNOSIS — J302 Other seasonal allergic rhinitis: Secondary | ICD-10-CM | POA: Diagnosis present

## 2022-05-17 DIAGNOSIS — K8010 Calculus of gallbladder with chronic cholecystitis without obstruction: Principal | ICD-10-CM | POA: Diagnosis present

## 2022-05-17 DIAGNOSIS — F909 Attention-deficit hyperactivity disorder, unspecified type: Secondary | ICD-10-CM | POA: Diagnosis present

## 2022-05-17 DIAGNOSIS — G8918 Other acute postprocedural pain: Secondary | ICD-10-CM | POA: Diagnosis not present

## 2022-05-17 DIAGNOSIS — Z9049 Acquired absence of other specified parts of digestive tract: Secondary | ICD-10-CM

## 2022-05-17 DIAGNOSIS — E669 Obesity, unspecified: Secondary | ICD-10-CM | POA: Diagnosis not present

## 2022-05-17 DIAGNOSIS — K5900 Constipation, unspecified: Secondary | ICD-10-CM | POA: Diagnosis present

## 2022-05-17 DIAGNOSIS — K760 Fatty (change of) liver, not elsewhere classified: Secondary | ICD-10-CM | POA: Diagnosis present

## 2022-05-17 DIAGNOSIS — R509 Fever, unspecified: Secondary | ICD-10-CM | POA: Diagnosis not present

## 2022-05-17 LAB — CBC WITH DIFFERENTIAL/PLATELET
Abs Immature Granulocytes: 0.05 10*3/uL (ref 0.00–0.07)
Basophils Absolute: 0.1 10*3/uL (ref 0.0–0.1)
Basophils Relative: 0 %
Eosinophils Absolute: 0 10*3/uL (ref 0.0–0.5)
Eosinophils Relative: 0 %
HCT: 34.4 % — ABNORMAL LOW (ref 36.0–46.0)
Hemoglobin: 11 g/dL — ABNORMAL LOW (ref 12.0–15.0)
Immature Granulocytes: 0 %
Lymphocytes Relative: 15 %
Lymphs Abs: 1.9 10*3/uL (ref 0.7–4.0)
MCH: 26.4 pg (ref 26.0–34.0)
MCHC: 32 g/dL (ref 30.0–36.0)
MCV: 82.7 fL (ref 80.0–100.0)
Monocytes Absolute: 0.6 10*3/uL (ref 0.1–1.0)
Monocytes Relative: 5 %
Neutro Abs: 9.6 10*3/uL — ABNORMAL HIGH (ref 1.7–7.7)
Neutrophils Relative %: 80 %
Platelets: 327 10*3/uL (ref 150–400)
RBC: 4.16 MIL/uL (ref 3.87–5.11)
RDW: 14.8 % (ref 11.5–15.5)
WBC: 12.2 10*3/uL — ABNORMAL HIGH (ref 4.0–10.5)
nRBC: 0 % (ref 0.0–0.2)

## 2022-05-17 LAB — COMPREHENSIVE METABOLIC PANEL
ALT: 40 U/L (ref 0–44)
AST: 32 U/L (ref 15–41)
Albumin: 3.9 g/dL (ref 3.5–5.0)
Alkaline Phosphatase: 77 U/L (ref 38–126)
Anion gap: 8 (ref 5–15)
BUN: 8 mg/dL (ref 6–20)
CO2: 27 mmol/L (ref 22–32)
Calcium: 9.1 mg/dL (ref 8.9–10.3)
Chloride: 104 mmol/L (ref 98–111)
Creatinine, Ser: 0.64 mg/dL (ref 0.44–1.00)
GFR, Estimated: >60 mL/min (ref >60)
Glucose, Bld: 97 mg/dL (ref 70–99)
Potassium: 3.8 mmol/L (ref 3.5–5.1)
Sodium: 139 mmol/L (ref 135–145)
Total Bilirubin: 0.5 mg/dL (ref 0.3–1.2)
Total Protein: 7.4 g/dL (ref 6.5–8.1)

## 2022-05-17 LAB — URINALYSIS, ROUTINE W REFLEX MICROSCOPIC
Bilirubin Urine: NEGATIVE
Glucose, UA: NEGATIVE mg/dL
Ketones, ur: NEGATIVE mg/dL
Leukocytes,Ua: NEGATIVE
Nitrite: NEGATIVE
Protein, ur: NEGATIVE mg/dL
Specific Gravity, Urine: 1.003 — ABNORMAL LOW (ref 1.005–1.030)
pH: 7 (ref 5.0–8.0)

## 2022-05-17 LAB — RESP PANEL BY RT-PCR (FLU A&B, COVID) ARPGX2
Influenza A by PCR: NEGATIVE
Influenza B by PCR: NEGATIVE
SARS Coronavirus 2 by RT PCR: NEGATIVE

## 2022-05-17 LAB — GROUP A STREP BY PCR: Group A Strep by PCR: NOT DETECTED

## 2022-05-17 LAB — LACTIC ACID, PLASMA
Lactic Acid, Venous: 1.6 mmol/L (ref 0.5–1.9)
Lactic Acid, Venous: 0.8 mmol/L (ref 0.5–1.9)

## 2022-05-17 LAB — POC URINE PREG, ED: Preg Test, Ur: NEGATIVE

## 2022-05-17 MED ORDER — SODIUM CHLORIDE 0.9 % IV SOLN
500.0000 mg | INTRAVENOUS | Status: DC
  Administered 2022-05-17: 500 mg via INTRAVENOUS
  Filled 2022-05-17: qty 5

## 2022-05-17 MED ORDER — PIPERACILLIN-TAZOBACTAM 3.375 G IVPB 30 MIN
3.3750 g | Freq: Once | INTRAVENOUS | Status: AC
  Administered 2022-05-17: 3.375 g via INTRAVENOUS
  Filled 2022-05-17: qty 50

## 2022-05-17 MED ORDER — ACETAMINOPHEN 325 MG PO TABS
650.0000 mg | ORAL_TABLET | Freq: Once | ORAL | Status: AC | PRN
  Administered 2022-05-17: 650 mg via ORAL
  Filled 2022-05-17: qty 2

## 2022-05-17 NOTE — ED Provider Notes (Signed)
[facility] EMERGENCY DEPARTMENT Provider Note   CSN: [id number] Arrival date & time: 05/17/22  1824     History  Chief Complaint  Patient presents with   Post-op Problem    Cindy Hamilton is a 36 y.o. female.  Reports she had a laparoscopic cholecystectomy done on 10/18.  Patient reports she developed a fever at home today.  Patient spoke to Dr. Constance Haw general surgeon at her procedure and she advised her to come to the emergency department.  Patient's temperature here today on arrival was 102.  Patient reports she is having some abdominal soreness.  Complains of a sore throat she has been exposed to strep.  Patient complains of a slight cough.  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  Patient denies any vomiting she has not had any diarrhea patient reports she has had some constipation since surgery.  She has been passing gas.  Patient denies any shortness of breath  The history is provided by the patient. No language interpreter was used.       Home Medications Prior to Admission medications   Medication Sig Start Date End Date Taking? Authorizing Provider  albuterol (VENTOLIN HFA) 108 (90 Base) MCG/ACT inhaler Inhale 2 puffs into the lungs every 6 (six) hours as needed for wheezing or shortness of breath. 04/03/21   Johnson, Megan P, DO  cholecalciferol (VITAMIN D3) 25 MCG (1000 UNIT) tablet Take 1,000 Units by mouth daily.    [provider]  famotidine (PEPCID) 20 MG tablet Take 1 tablet (20 mg total) by mouth 2 (two) times daily. Patient taking differently: Take 20 mg by mouth at bedtime. 01/03/22   Kathyrn Drown, MD  ferrous sulfate 325 (65 FE) MG EC tablet Take 1 tablet (325 mg total) by mouth daily. 11/03/20   Vigg, Avanti, MD  FLUoxetine (PROZAC) 20 MG capsule Take 1 capsule by mouth daily. 12/19/21 12/19/22  Kathyrn Drown, MD  fluticasone (FLOVENT HFA) 110 MCG/ACT inhaler Use 2 puffs twice a day as directed to help control  reactive airway 12/19/21   Kathyrn Drown, MD  methylphenidate (METADATE CD) 20 MG CR capsule Take 1 capsule (20 mg total) by mouth every morning. (fill 05/16/12) 05/01/22   Kathyrn Drown, MD  methylphenidate (METADATE CD) 20 MG CR capsule Take 1 capsule (20 mg total) by mouth every morning. (fill 06/14/22) 05/01/22   Kathyrn Drown, MD  methylphenidate (METADATE CD) 20 MG CR capsule Take 1 capsule (20 mg total) by mouth every morning. (fill 07/14/22) 05/01/22   Kathyrn Drown, MD  ondansetron (ZOFRAN) 4 MG tablet Take 1 tablet (4 mg total) by mouth every 8 (eight) hours as needed. 05/15/22 05/15/23  Virl Cagey, MD  oxyCODONE (ROXICODONE) 5 MG immediate release tablet Take 1 tablet (5 mg total) by mouth every 4 (four) hours as needed for breakthrough pain or severe pain. 05/15/22 05/15/23  Virl Cagey, MD  valACYclovir (VALTREX) 1000 MG tablet Take 1 tablet by mouth twice daily Patient taking differently: Take 1,000 mg by mouth daily as needed. 11/03/20   Charlynne Cousins, MD      Allergies    Zinc    Review of Systems   Review of Systems  All other systems reviewed and are negative.   Physical Exam Updated Vital Signs BP 106/62 (BP Location: Right Arm)   Pulse 99   Temp (!) 100.4 F (38 C) (Oral)   Resp 18  Ht 5\' 10"  (1.778 m)   Wt 115.7 kg   LMP 05/04/2022   SpO2 97%   BMI 36.59 kg/m  Physical Exam Vitals and nursing note reviewed.  Constitutional:      Appearance: She is well-developed.  HENT:     Head: Normocephalic.     Mouth/Throat:     Mouth: Mucous membranes are moist.  Eyes:     Pupils: Pupils are equal, round, and reactive to light.  Cardiovascular:     Rate and Rhythm: Normal rate.  Pulmonary:     Effort: Pulmonary effort is normal.  Abdominal:     General: Abdomen is flat. There is no distension.  Musculoskeletal:        General: Normal range of motion.     Cervical back: Normal range of motion.  Skin:    General: Skin is warm.  Neurological:      Mental Status: She is alert and oriented to person, place, and time.  Psychiatric:        Mood and Affect: Mood normal.     ED Results / Procedures / Treatments   Labs (all labs ordered are listed, but only abnormal results are displayed) Labs Reviewed  CBC WITH DIFFERENTIAL/PLATELET - Abnormal; Notable for the following components:      Result Value   WBC 12.2 (*)    Hemoglobin 11.0 (*)    HCT 34.4 (*)    Neutro Abs 9.6 (*)    All other components within normal limits  URINALYSIS, ROUTINE W REFLEX MICROSCOPIC - Abnormal; Notable for the following components:   Color, Urine STRAW (*)    Specific Gravity, Urine 1.003 (*)    Hgb urine dipstick SMALL (*)    Bacteria, UA RARE (*)    All other components within normal limits  GROUP A STREP BY PCR  RESP PANEL BY RT-PCR (FLU A&B, COVID) [id number]  LACTIC ACID, PLASMA  COMPREHENSIVE METABOLIC PANEL  LACTIC ACID, PLASMA  POC URINE PREG, ED    EKG None  Radiology DG Chest 2 View  Result Date: 05/17/2022 CLINICAL DATA:  Postop fever and productive cough EXAM: CHEST - 2 VIEW COMPARISON:  None Available. FINDINGS: Bilateral lower lung infiltrates greater on the left. No pleural effusion or pneumothorax. Normal cardiomediastinal silhouette. No acute osseous abnormality. IMPRESSION: Bilateral lower lung infiltrates are nonspecific and may be due to atelectasis, infection, or aspiration. Electronically Signed   By: 05/19/2022 M.D.   On: 05/17/2022 18:55    Procedures Procedures    Medications Ordered in ED Medications  acetaminophen (TYLENOL) tablet 650 mg (650 mg Oral Given 05/17/22 1839)    ED Course/ Medical Decision Making/ A&P                           Medical Decision Making Patient had a cholecystectomy done on October 18.  Patient has had a fever today  Amount and/or Complexity of Data Reviewed Independent Historian:     Details: Patient is here with family who are supportive External Data Reviewed: notes.     Details: Neurosurgery notes reviewed Labs: ordered. Decision-making details documented in ED Course.    Details: Labs ordered reviewed and interpreted patient's COVID and influenza are negative strep screen is negative.  Patient has an elevated white blood cell count of 12.2 Radiology: ordered and independent interpretation performed. Decision-making details documented in ED Course.    Details: Chest x-ray shows Discussion of management or test interpretation with  external provider(s): I discussed the patient with Dr. Lovell Sheehan general surgeon on call.  He advised to give patient Zosyn IV and admit overnight for observation.  He states he will see the patient in the a.m. and reevaluate.  He advised he does not want CT scan at this time and that patient can eat.  We will obtain CT scan in the a.m. if patient has had any increasing abdominal pain. Consulted hospitalist for admission.  Risk OTC drugs. Risk Details: Patient counseled on findings she will be given Zosyn IV for possible pneumonia           Final Clinical Impression(s) / ED Diagnoses Final diagnoses:  Fever, unspecified fever cause  Pneumonia of both lower lobes due to infectious organism  Post-operative pain    Rx / DC Orders ED Discharge Orders     None         Osie Cheeks 05/17/22 2201    Lonell Grandchild, MD 05/18/22 0003

## 2022-05-17 NOTE — H&P (Signed)
History and Physical    Patient: Cindy Hamilton FYT:[id number] DOB: 14-Jan-1986 DOA: 05/17/2022 DOS: the patient was seen and examined on 05/18/2022 PCP: Babs Sciara, MD  Patient coming from: Home  Chief Complaint:  Chief Complaint  Patient presents with   Post-op Problem   HPI: Cindy Hamilton is a 36 y.o. female with medical history significant of asthma, depression, anxiety who presents to the emergency department due to fever at home today.  Patient had laparoscopic cholecystectomy done on 10/18 by Dr. Henreitta Leber.  She called her after having fever and she was advised to go to the ED for further evaluation and management.  She complained of mild soreness of her abdomen which worsens on coughing, otherwise, she denies any other complaint.  ED Course:  In the emergency department, she was febrile on arrival with a temperature of 102, respiratory rate 22, pulse 115, BP 133/64, O2 sat 98%.  Work-up in the ED showed leukocytosis and normocytic anemia.  BMP was normal, lactic acid x2 was negative, I urine pregnancy test was negative, urinalysis was normal.  Influenza A, B, SARS coronavirus 2 was negative.  Group A streptococcal by PCR was not detected. Chest x-ray showed bilateral lower lung infiltrates are nonspecific and may be due to atelectasis, infection, or aspiration. General surgery (Dr. Lovell Sheehan) was consulted and recommended admitting patient with plan to follow-up on consult in the morning by ED PA.  Patient was treated with IV Zosyn and azithromycin.  Hospitalist was asked to admit patient for further evaluation and management.  Review of Systems: Review of systems as noted in the HPI. All other systems reviewed and are negative.   Past Medical History:  Diagnosis Date   ADD (attention deficit disorder) 12/19/2021   Diagnosed by psychology   Anemia    Anxiety    Asthma    Depression    GERD (gastroesophageal reflux disease) 12/24/2021   On famotidine 20 mg   Heart  murmur    Herpes labialis without complication 12/24/2021   Uses intermittent valacyclovir   Hx of cold sores 12/24/2021   Uses intermittent valacyclovir   Iron deficiency anemia, unspecified 12/24/2021   Due to heavy cycles.  Takes ferrous sulfate 325 mg daily   Reactive airway disease 12/19/2021   Worse during seasonal allergy peaks   Past Surgical History:  Procedure Laterality Date   CHOLECYSTECTOMY N/A 05/15/2022   Procedure: LAPAROSCOPIC CHOLECYSTECTOMY;  Surgeon: Lucretia Roers, MD;  Location: [facility];  Service: General;  Laterality: N/A;  pt knows to arrive at 7:00   COLONOSCOPY     remote past in [place].    Social History:  reports that she has never smoked. She has never used smokeless tobacco. She reports that she does not drink alcohol and does not use drugs.   Allergies  Allergen Reactions   Zinc Dermatitis    Family History  Problem Relation Age of Onset   Osteopenia Father    Prostate cancer Father    Sleep apnea Father    Colon polyps Brother        unknown if adenomas   Cancer Paternal Aunt        Ovarian   Cancer Maternal Grandmother        Non-Hodgkin's lymphoma   Diabetes Maternal Grandfather    Heart disease Maternal Grandfather    Prostate cancer Paternal Grandfather    Colon cancer Neg Hx      Prior to Admission medications   Medication Sig Start Date End  Date Taking? Authorizing Provider  albuterol (VENTOLIN HFA) 108 (90 Base) MCG/ACT inhaler Inhale 2 puffs into the lungs every 6 (six) hours as needed for wheezing or shortness of breath. 04/03/21   Johnson, Megan P, DO  cholecalciferol (VITAMIN D3) 25 MCG (1000 UNIT) tablet Take 1,000 Units by mouth daily.    [provider]  famotidine (PEPCID) 20 MG tablet Take 1 tablet (20 mg total) by mouth 2 (two) times daily. Patient taking differently: Take 20 mg by mouth at bedtime. 01/03/22   Kathyrn Drown, MD  ferrous sulfate 325 (65 FE) MG EC tablet Take 1 tablet (325 mg total) by mouth daily.  11/03/20   Vigg, Avanti, MD  FLUoxetine (PROZAC) 20 MG capsule Take 1 capsule by mouth daily. 12/19/21 12/19/22  Kathyrn Drown, MD  fluticasone (FLOVENT HFA) 110 MCG/ACT inhaler Use 2 puffs twice a day as directed to help control reactive airway 12/19/21   Kathyrn Drown, MD  methylphenidate (METADATE CD) 20 MG CR capsule Take 1 capsule (20 mg total) by mouth every morning. (fill 05/16/12) 05/01/22   Kathyrn Drown, MD  methylphenidate (METADATE CD) 20 MG CR capsule Take 1 capsule (20 mg total) by mouth every morning. (fill 06/14/22) 05/01/22   Kathyrn Drown, MD  methylphenidate (METADATE CD) 20 MG CR capsule Take 1 capsule (20 mg total) by mouth every morning. (fill 07/14/22) 05/01/22   Kathyrn Drown, MD  ondansetron (ZOFRAN) 4 MG tablet Take 1 tablet (4 mg total) by mouth every 8 (eight) hours as needed. 05/15/22 05/15/23  Virl Cagey, MD  oxyCODONE (ROXICODONE) 5 MG immediate release tablet Take 1 tablet (5 mg total) by mouth every 4 (four) hours as needed for breakthrough pain or severe pain. 05/15/22 05/15/23  Virl Cagey, MD  valACYclovir (VALTREX) 1000 MG tablet Take 1 tablet by mouth twice daily Patient taking differently: Take 1,000 mg by mouth daily as needed. 11/03/20   Charlynne Cousins, MD    Physical Exam: BP (!) 108/58   Pulse 79   Temp (!) 100.4 F (38 C) (Oral)   Resp 17   Ht 5\' 10"  (1.778 m)   Wt 115.7 kg   LMP 05/04/2022   SpO2 99%   BMI 36.59 kg/m   General: 36 y.o. year-old female well developed well nourished in no acute distress.  Alert and oriented x3. HEENT: NCAT, EOMI Neck: Supple, trachea medial Cardiovascular: Regular rate and rhythm with no rubs or gallops.  No thyromegaly or JVD noted.  No lower extremity edema. 2/4 pulses in all 4 extremities. Respiratory: Clear to auscultation with no wheezes or rales. Good inspiratory effort. Abdomen: Soft, mild tenderness to palpation.  Nondistended with normal bowel sounds x4 quadrants.  Surgical sites clean  without any erythema or drainage. Muskuloskeletal: No cyanosis, clubbing or edema noted bilaterally Neuro: CN II-XII intact, strength 5/5 x 4, sensation, reflexes intact Skin: No ulcerative lesions noted or rashes Psychiatry: Judgement and insight appear normal. Mood is appropriate for condition and setting          Labs on Admission:  Basic Metabolic Panel: Recent Labs  Lab 05/17/22 1857  NA 139  K 3.8  CL 104  CO2 27  GLUCOSE 97  BUN 8  CREATININE 0.64  CALCIUM 9.1   Liver Function Tests: Recent Labs  Lab 05/17/22 1857  AST 32  ALT 40  ALKPHOS 77  BILITOT 0.5  PROT 7.4  ALBUMIN 3.9   No results for input(s): "LIPASE", "AMYLASE"  in the last 168 hours. No results for input(s): "AMMONIA" in the last 168 hours. CBC: Recent Labs  Lab 05/13/22 1552 05/17/22 1857  WBC 8.5 12.2*  NEUTROABS 5.3 9.6*  HGB 11.4* 11.0*  HCT 35.0* 34.4*  MCV 81.2 82.7  PLT 274 327   Cardiac Enzymes: No results for input(s): "CKTOTAL", "CKMB", "CKMBINDEX", "TROPONINI" in the last 168 hours.  BNP (last 3 results) No results for input(s): "BNP" in the last 8760 hours.  ProBNP (last 3 results) No results for input(s): "PROBNP" in the last 8760 hours.  CBG: No results for input(s): "GLUCAP" in the last 168 hours.  Radiological Exams on Admission: DG Chest 2 View  Result Date: 05/17/2022 CLINICAL DATA:  Postop fever and productive cough EXAM: CHEST - 2 VIEW COMPARISON:  None Available. FINDINGS: Bilateral lower lung infiltrates greater on the left. No pleural effusion or pneumothorax. Normal cardiomediastinal silhouette. No acute osseous abnormality. IMPRESSION: Bilateral lower lung infiltrates are nonspecific and may be due to atelectasis, infection, or aspiration. Electronically Signed   By: Placido Sou M.D.   On: 05/17/2022 18:55    EKG: I independently viewed the EKG done and my findings are as followed:    Assessment/Plan Present on Admission:  CAP (community acquired  pneumonia)  GERD (gastroesophageal reflux disease)  Depression, recurrent ([place])  Iron deficiency anemia, unspecified  Principal Problem:   CAP (community acquired pneumonia) Active Problems:   Depression, recurrent ([place])   GERD (gastroesophageal reflux disease)   Iron deficiency anemia, unspecified   Acute febrile illness   Obesity (BMI 30-39.9)   CAP POA Chest x-ray was suggestive of atelectasis and aspiration  Patient was started on Zosyn and azithromycin, we shall continue with Unasyn and azithromycin at this time with plan to de-escalate/discontinue based on blood culture, sputum culture, urine Legionella, strep pneumo and procalcitonin Continue Tylenol as needed Continue Mucinex, incentive spirometry, flutter valve   Acute febrile illness possibly due to postop fever vs infectious Continue Tylenol as needed Patient will be placed n.p.o. in anticipation for possible surgical intervention in the morning General surgery (Dr. Arnoldo Morale) was consulted and will follow-up with patient in the morning per ED PA  Asthma Continue Ventolin, Flovent  Depression Continue Prozac when patient resumes oral intake  Iron deficiency anemia Continue ferrous sulfate  Obesity (BMI 36.59) Diet and lifestyle modification  GERD Continue Pepcid when patient resumes oral intake  DVT prophylaxis: SCDs (consider starting patient on chemoprophylaxis if no indication for surgical intervention).  Code Status: Full code  Consults: General surgery  Family Communication: Husband at bedside (all questions answered to satisfaction)  Severity of Illness: The appropriate patient status for this patient is INPATIENT. Inpatient status is judged to be reasonable and necessary in order to provide the required intensity of service to ensure the patient's safety. The patient's presenting symptoms, physical exam findings, and initial radiographic and laboratory data in the context of their chronic  comorbidities is felt to place them at high risk for further clinical deterioration. Furthermore, it is not anticipated that the patient will be medically stable for discharge from the hospital within 2 midnights of admission.   * I certify that at the point of admission it is my clinical judgment that the patient will require inpatient hospital care spanning beyond 2 midnights from the point of admission due to high intensity of service, high risk for further deterioration and high frequency of surveillance required.*  Author: Bernadette Hoit, DO 05/18/2022 3:29 AM  For on call review [email or web address].

## 2022-05-17 NOTE — ED Triage Notes (Signed)
Pt had laparoscopic gallbladder removal by Dr. Constance Haw on 10/18. C/o fever today with productive cough and dyspnea on exertion. Pt also c/o sore throat, body aches and ha. Pt called Dr. Constance Haw pta and was told to come to ED

## 2022-05-18 DIAGNOSIS — R509 Fever, unspecified: Secondary | ICD-10-CM

## 2022-05-18 DIAGNOSIS — J189 Pneumonia, unspecified organism: Secondary | ICD-10-CM | POA: Diagnosis not present

## 2022-05-18 DIAGNOSIS — E669 Obesity, unspecified: Secondary | ICD-10-CM

## 2022-05-18 DIAGNOSIS — K219 Gastro-esophageal reflux disease without esophagitis: Secondary | ICD-10-CM | POA: Diagnosis not present

## 2022-05-18 LAB — HIV ANTIBODY (ROUTINE TESTING W REFLEX): HIV Screen 4th Generation wRfx: NONREACTIVE

## 2022-05-18 LAB — CBC
HCT: 30.8 % — ABNORMAL LOW (ref 36.0–46.0)
Hemoglobin: 10.1 g/dL — ABNORMAL LOW (ref 12.0–15.0)
MCH: 26.6 pg (ref 26.0–34.0)
MCHC: 32.8 g/dL (ref 30.0–36.0)
MCV: 81.1 fL (ref 80.0–100.0)
Platelets: 261 10*3/uL (ref 150–400)
RBC: 3.8 MIL/uL — ABNORMAL LOW (ref 3.87–5.11)
RDW: 14.6 % (ref 11.5–15.5)
WBC: 7.4 10*3/uL (ref 4.0–10.5)
nRBC: 0 % (ref 0.0–0.2)

## 2022-05-18 LAB — COMPREHENSIVE METABOLIC PANEL
ALT: 35 U/L (ref 0–44)
AST: 25 U/L (ref 15–41)
Albumin: 3.4 g/dL — ABNORMAL LOW (ref 3.5–5.0)
Alkaline Phosphatase: 70 U/L (ref 38–126)
Anion gap: 9 (ref 5–15)
BUN: 7 mg/dL (ref 6–20)
CO2: 24 mmol/L (ref 22–32)
Calcium: 8.5 mg/dL — ABNORMAL LOW (ref 8.9–10.3)
Chloride: 106 mmol/L (ref 98–111)
Creatinine, Ser: 0.55 mg/dL (ref 0.44–1.00)
GFR, Estimated: >60 mL/min (ref >60)
Glucose, Bld: 94 mg/dL (ref 70–99)
Potassium: 3.3 mmol/L — ABNORMAL LOW (ref 3.5–5.1)
Sodium: 139 mmol/L (ref 135–145)
Total Bilirubin: 0.7 mg/dL (ref 0.3–1.2)
Total Protein: 6.6 g/dL (ref 6.5–8.1)

## 2022-05-18 LAB — PROCALCITONIN: Procalcitonin: <0.1 ng/mL

## 2022-05-18 LAB — MAGNESIUM: Magnesium: 2 mg/dL (ref 1.7–2.4)

## 2022-05-18 LAB — PHOSPHORUS: Phosphorus: 3.6 mg/dL (ref 2.5–4.6)

## 2022-05-18 MED ORDER — ACETAMINOPHEN 325 MG PO TABS: 650.0000 mg | ORAL_TABLET | Freq: Four times a day (QID) | ORAL | Status: DC | PRN

## 2022-05-18 MED ORDER — ONDANSETRON HCL 4 MG PO TABS: 4.0000 mg | ORAL_TABLET | Freq: Four times a day (QID) | ORAL | Status: DC | PRN

## 2022-05-18 MED ORDER — ALBUTEROL SULFATE HFA 108 (90 BASE) MCG/ACT IN AERS: 2.0000 | INHALATION_SPRAY | Freq: Four times a day (QID) | RESPIRATORY_TRACT | 0 refills | Status: DC | PRN

## 2022-05-18 MED ORDER — ONDANSETRON HCL 4 MG/2ML IJ SOLN: 4.0000 mg | Freq: Four times a day (QID) | INTRAMUSCULAR | Status: DC | PRN

## 2022-05-18 MED ORDER — AMOXICILLIN-POT CLAVULANATE 875-125 MG PO TABS: 1.0000 | ORAL_TABLET | Freq: Two times a day (BID) | ORAL | 0 refills | Status: AC

## 2022-05-18 MED ORDER — DM-GUAIFENESIN ER 30-600 MG PO TB12
1.0000 | ORAL_TABLET | Freq: Two times a day (BID) | ORAL | Status: DC
  Administered 2022-05-18: 1 via ORAL
  Filled 2022-05-18: qty 1

## 2022-05-18 MED ORDER — FAMOTIDINE 20 MG PO TABS: 20.0000 mg | ORAL_TABLET | Freq: Every day | ORAL | Status: DC

## 2022-05-18 MED ORDER — FERROUS SULFATE 325 (65 FE) MG PO TABS
325.0000 mg | ORAL_TABLET | Freq: Every day | ORAL | Status: DC
  Administered 2022-05-18: 325 mg via ORAL
  Filled 2022-05-18: qty 1

## 2022-05-18 MED ORDER — ALBUTEROL SULFATE (2.5 MG/3ML) 0.083% IN NEBU
2.5000 mg | INHALATION_SOLUTION | Freq: Four times a day (QID) | RESPIRATORY_TRACT | Status: DC | PRN
  Administered 2022-05-18: 2.5 mg via RESPIRATORY_TRACT
  Filled 2022-05-18: qty 3

## 2022-05-18 MED ORDER — AMOXICILLIN-POT CLAVULANATE 875-125 MG PO TABS: 1.0000 | ORAL_TABLET | Freq: Two times a day (BID) | ORAL | 0 refills | Status: DC

## 2022-05-18 MED ORDER — FLUOXETINE HCL 20 MG PO CAPS
20.0000 mg | ORAL_CAPSULE | Freq: Every day | ORAL | Status: DC
  Administered 2022-05-18: 20 mg via ORAL
  Filled 2022-05-18: qty 1

## 2022-05-18 MED ORDER — BUDESONIDE 0.25 MG/2ML IN SUSP
0.2500 mg | Freq: Two times a day (BID) | RESPIRATORY_TRACT | Status: DC
  Administered 2022-05-18: 0.25 mg via RESPIRATORY_TRACT
  Filled 2022-05-18: qty 2

## 2022-05-18 MED ORDER — FLUTICASONE PROPIONATE HFA 110 MCG/ACT IN AERO: 2.0000 | INHALATION_SPRAY | Freq: Two times a day (BID) | RESPIRATORY_TRACT | Status: DC

## 2022-05-18 MED ORDER — ALBUTEROL SULFATE HFA 108 (90 BASE) MCG/ACT IN AERS: 2.0000 | INHALATION_SPRAY | Freq: Four times a day (QID) | RESPIRATORY_TRACT | Status: DC | PRN

## 2022-05-18 MED ORDER — SODIUM CHLORIDE 0.9 % IV SOLN
3.0000 g | Freq: Four times a day (QID) | INTRAVENOUS | Status: DC
  Administered 2022-05-18: 3 g via INTRAVENOUS
  Filled 2022-05-18: qty 8

## 2022-05-18 MED ORDER — POTASSIUM CHLORIDE CRYS ER 20 MEQ PO TBCR
40.0000 meq | EXTENDED_RELEASE_TABLET | ORAL | Status: DC
  Administered 2022-05-18: 40 meq via ORAL
  Filled 2022-05-18: qty 2

## 2022-05-18 NOTE — Progress Notes (Signed)
3 Days Post-Op  Subjective: Patient with significant cough.  She denies any abdominal pain, nausea, vomiting.  Overnight, she has not spiked a fever while on IV antibiotic.  Objective: Vital signs in last 24 hours: Temp:  [98.5 F (36.9 C)-102 F (38.9 C)] 98.7 F (37.1 C) (10/21 0839) Pulse Rate:  [79-115] 92 (10/21 0839) Resp:  [17-22] 19 (10/21 0839) BP: (101-133)/(48-64) 112/56 (10/21 0839) SpO2:  [88 %-99 %] 94 % (10/21 0839) Weight:  [115.7 kg] 115.7 kg (10/20 1832)    Intake/Output from previous day: No intake/output data recorded. Intake/Output this shift: No intake/output data recorded.  General appearance: alert, cooperative, and no distress Resp: clear to auscultation bilaterally Cardio: regular rate and rhythm, S1, S2 normal, no murmur, click, rub or gallop GI: Soft, incisions healing well.  Lab Results:  Recent Labs    05/17/22 1857 05/18/22 0642  WBC 12.2* 7.4  HGB 11.0* 10.1*  HCT 34.4* 30.8*  PLT 327 261   BMET Recent Labs    05/17/22 1857 05/18/22 0642  NA 139 139  K 3.8 3.3*  CL 104 106  CO2 27 24  GLUCOSE 97 94  BUN 8 7  CREATININE 0.64 0.55  CALCIUM 9.1 8.5*   PT/INR No results for input(s): "LABPROT", "INR" in the last 72 hours.  Studies/Results: DG Chest 2 View  Result Date: 05/17/2022 CLINICAL DATA:  Postop fever and productive cough EXAM: CHEST - 2 VIEW COMPARISON:  None Available. FINDINGS: Bilateral lower lung infiltrates greater on the left. No pleural effusion or pneumothorax. Normal cardiomediastinal silhouette. No acute osseous abnormality. IMPRESSION: Bilateral lower lung infiltrates are nonspecific and may be due to atelectasis, infection, or aspiration. Electronically Signed   By: Placido Sou M.D.   On: 05/17/2022 18:55    Anti-infectives: Anti-infectives (From admission, onward)    Start     Dose/Rate Route Frequency Ordered Stop   05/15/22 0715  cefoTEtan (CEFOTAN) 2 g in sodium chloride 0.9 % 100 mL IVPB        2  g 200 mL/hr over 30 Minutes Intravenous On call to O.R. 05/15/22 6433 05/15/22 0932   05/15/22 0655  sodium chloride 0.9 % with cefoTEtan (CEFOTAN) ADS Med       Note to Pharmacy: Charm Barges S: cabinet override      05/15/22 0655 05/15/22 0953       Assessment/Plan: s/p Procedure(s): LAPAROSCOPIC CHOLECYSTECTOMY Impression: Suspect pulmonary source for patient's fever and cough.  No indication to do a CT scan of the abdomen.  Discussed with Dr. Denton Brick and the patient.  Patient tolerated ambulation without drop in her's oxygen saturations.  Will discharge home on Augmentin.  Follow-up with Dr. Constance Haw as previously scheduled.  LOS: 0 days    [facility] Signs 05/18/2022

## 2022-05-18 NOTE — Discharge Instructions (Signed)
1)Please take Augmentin antibiotic as prescribed for presumed pneumonia 2)follow up with general surgeon Dr. Constance Haw as previously scheduled

## 2022-05-18 NOTE — Discharge Summary (Signed)
Cindy Hamilton, is a 36 y.o. female  DOB 1986-06-27  MRN [id number].  Admission date:  05/17/2022  Admitting Physician  Bernadette Hoit, DO  Discharge Date:  05/18/2022   Primary MD  Kathyrn Drown, MD  Recommendations for primary care physician for things to follow:   1)Please take Augmentin antibiotic as prescribed for presumed pneumonia 2)follow up with general surgeon Dr. Constance Haw as previously scheduled  Admission Diagnosis  CAP (community acquired pneumonia) [J18.9]   Discharge Diagnosis  CAP (community acquired pneumonia) [J18.9]    Principal Problem:   CAP (community acquired pneumonia) Active Problems:   Acute febrile illness   Depression, recurrent (HCC)   GERD (gastroesophageal reflux disease)   Iron deficiency anemia, unspecified   Obesity (BMI 30-39.9)      Past Medical History:  Diagnosis Date   ADD (attention deficit disorder) 12/19/2021   Diagnosed by psychology   Anemia    Anxiety    Asthma    Depression    GERD (gastroesophageal reflux disease) 12/24/2021   On famotidine 20 mg   Heart murmur    Herpes labialis without complication 0/25/4270   Uses intermittent valacyclovir   Hx of cold sores 12/24/2021   Uses intermittent valacyclovir   Iron deficiency anemia, unspecified 12/24/2021   Due to heavy cycles.  Takes ferrous sulfate 325 mg daily   Reactive airway disease 12/19/2021   Worse during seasonal allergy peaks    Past Surgical History:  Procedure Laterality Date   CHOLECYSTECTOMY N/A 05/15/2022   Procedure: LAPAROSCOPIC CHOLECYSTECTOMY;  Surgeon: Virl Cagey, MD;  Location: [facility];  Service: General;  Laterality: N/A;  pt knows to arrive at 7:00   COLONOSCOPY     remote past in [place].     HPI  from the history and physical done on the day of admission:    HPI: Cindy Hamilton is a 36 y.o. female with medical history significant of asthma,  depression, anxiety who presents to the emergency department due to fever at home today.  Patient had laparoscopic cholecystectomy done on 10/18 by Dr. Constance Haw.  She called her after having fever and she was advised to go to the ED for further evaluation and management.  She complained of mild soreness of her abdomen which worsens on coughing, otherwise, she denies any other complaint.   ED Course:  In the emergency department, she was febrile on arrival with a temperature of 102, respiratory rate 22, pulse 115, BP 133/64, O2 sat 98%.  Work-up in the ED showed leukocytosis and normocytic anemia.  BMP was normal, lactic acid x2 was negative, I urine pregnancy test was negative, urinalysis was normal.  Influenza A, B, SARS coronavirus 2 was negative.  Group A streptococcal by PCR was not detected. Chest x-ray showed bilateral lower lung infiltrates are nonspecific and may be due to atelectasis, infection, or aspiration. General surgery (Dr. Arnoldo Morale) was consulted and recommended admitting patient with plan to follow-up on consult in the morning by ED PA.  Patient was treated with IV  Zosyn and azithromycin.  Hospitalist was asked to admit patient for further evaluation and management.   Review of Systems: Review of systems as noted in the HPI. All other systems reviewed and are negative.   Hospital Course:    Assessment and Plan: 1) acute febrile illness in the postop patient--suspect pneumonia related -Patient treated with IV Unasyn/Zosyn and azithromycin -Respiratory status improved -Weaned off oxygen post ablation O2 sats on room air is 98% -Leukocytosis resolved-- WBC 12 >>7.4 -No further fevers -COVID, influenza and strep group A negative -Cough persist---bronchodilators and mucolytics as ordered  2) acute anemia--- in the postoperative patient... Suspect some hemodilution component from IV fluids perioperatively -Hemoglobin currently above 10 -Recent anemia work-up with normal serum iron  but low iron saturation, ferritin was WNL -MCV, MCH and RDW WNL -Consider fasting B12 and folate as outpatient if anemia does not resolve in the near future -No bleeding concerns at this time otherwise -Consider repeat CBC as outpatient -  3) chronic cholecystitis and cholelithiasis--status post lap chole on 05/15/2022 -Postoperative management as per Dr. Constance Haw and Dr. Arnoldo Morale -Patient was evaluated on 05/18/2022 by Dr. Arnoldo Morale with me in the ED  4)GERD-stable, continue Pepcid as needed  5) depressive disorder--- stable, continue PTA meds  6)Asthma--- bronchodilators and mucolytics as ordered please see #1 above  7)Hypokalemia--- Replaced  Discharge Condition: Stable  Follow UP----General surgeon Dr. Constance Haw   Consults obtained -Gen surgery  Diet and Activity recommendation:  As advised  Discharge Instructions    Discharge Instructions     Call MD for:  difficulty breathing, headache or visual disturbances   Complete by: As directed    Call MD for:  persistant dizziness or light-headedness   Complete by: As directed    Call MD for:  persistant nausea and vomiting   Complete by: As directed    Call MD for:  severe uncontrolled pain   Complete by: As directed    Call MD for:  temperature >100.4   Complete by: As directed    Diet - low sodium heart healthy   Complete by: As directed    Discharge instructions   Complete by: As directed    1)Please take Augmentin antibiotic as prescribed for presumed pneumonia 2)follow up with general surgeon Dr. Constance Haw as previously scheduled   Discharge wound care:   Complete by: As directed    Routine postoperative wound care as previously advised by Dr. Constance Haw   Increase activity slowly   Complete by: As directed        Discharge Medications     Allergies as of 05/18/2022       Reactions   Zinc Dermatitis        Medication List     TAKE these medications    albuterol 108 (90 Base) MCG/ACT inhaler Commonly  known as: VENTOLIN HFA Inhale 2 puffs into the lungs every 6 (six) hours as needed for wheezing or shortness of breath.   amoxicillin-clavulanate 875-125 MG tablet Commonly known as: AUGMENTIN Take 1 tablet by mouth 2 (two) times daily for 7 days.   cholecalciferol 25 MCG (1000 UNIT) tablet Commonly known as: VITAMIN D3 Take 1,000 Units by mouth daily.   famotidine 20 MG tablet Commonly known as: Pepcid Take 1 tablet (20 mg total) by mouth 2 (two) times daily. What changed: when to take this   ferrous sulfate 325 (65 FE) MG EC tablet Take 1 tablet (325 mg total) by mouth daily.   Flovent HFA 110 MCG/ACT inhaler  Generic drug: fluticasone Use 2 puffs twice a day as directed to help control reactive airway   FLUoxetine 20 MG capsule Commonly known as: PROZAC Take 1 capsule by mouth daily.   methylphenidate 20 MG CR capsule Commonly known as: METADATE CD Take 1 capsule (20 mg total) by mouth every morning. (fill 05/16/12) What changed: Another medication with the same name was removed. Continue taking this medication, and follow the directions you see here.   ondansetron 4 MG tablet Commonly known as: Zofran Take 1 tablet (4 mg total) by mouth every 8 (eight) hours as needed.   oxyCODONE 5 MG immediate release tablet Commonly known as: Roxicodone Take 1 tablet (5 mg total) by mouth every 4 (four) hours as needed for breakthrough pain or severe pain.   valACYclovir 1000 MG tablet Commonly known as: VALTREX Take 1 tablet by mouth twice daily What changed:  when to take this reasons to take this               Discharge Care Instructions  (From admission, onward)           Start     Ordered   05/18/22 0000  Discharge wound care:       Comments: Routine postoperative wound care as previously advised by Dr. Constance Haw   05/18/22 1002            Major procedures and Radiology Reports - PLEASE review detailed and final reports for all details, in brief -    DG Chest 2 View  Result Date: 05/17/2022 CLINICAL DATA:  Postop fever and productive cough EXAM: CHEST - 2 VIEW COMPARISON:  None Available. FINDINGS: Bilateral lower lung infiltrates greater on the left. No pleural effusion or pneumothorax. Normal cardiomediastinal silhouette. No acute osseous abnormality. IMPRESSION: Bilateral lower lung infiltrates are nonspecific and may be due to atelectasis, infection, or aspiration. Electronically Signed   By: Placido Sou M.D.   On: 05/17/2022 18:55    Micro Results   Recent Results (from the past 240 hour(s))  Group A Strep by PCR     Status: None   Collection Time: 05/17/22  7:42 PM   Specimen: Throat; Sterile Swab  Result Value Ref Range Status   Group A Strep by PCR NOT DETECTED NOT DETECTED Final    Comment: Performed at [facility], [address], [place], [place] [address]  Resp Panel by RT-PCR (Flu A&B, Covid) Throat     Status: None   Collection Time: 05/17/22  7:42 PM   Specimen: Throat; Nasal Swab  Result Value Ref Range Status   SARS Coronavirus 2 by RT PCR NEGATIVE NEGATIVE Final    Comment: (NOTE) SARS-CoV-2 target nucleic acids are NOT DETECTED.  The SARS-CoV-2 RNA is generally detectable in upper respiratory specimens during the acute phase of infection. The lowest concentration of SARS-CoV-2 viral copies this assay can detect is 138 copies/mL. A negative result does not preclude SARS-Cov-2 infection and should not be used as the sole basis for treatment or other patient management decisions. A negative result may occur with  improper specimen collection/handling, submission of specimen other than nasopharyngeal swab, presence of viral mutation(s) within the areas targeted by this assay, and inadequate number of viral copies(<138 copies/mL). A negative result must be combined with clinical observations, patient history, and epidemiological information. The expected result is Negative.  Fact Sheet for Patients:   EntrepreneurPulse.com.au  Fact Sheet for Healthcare Providers:  IncredibleEmployment.be  This test is no t yet approved or  cleared by the [place] and  has been authorized for detection and/or diagnosis of SARS-CoV-2 by FDA under an Emergency Use Authorization (EUA). This EUA will remain  in effect (meaning this test can be used) for the duration of the COVID-19 declaration under Section 564(b)(1) of the Act, 21 U.S.C.section 360bbb-3(b)(1), unless the authorization is terminated  or revoked sooner.       Influenza A by PCR NEGATIVE NEGATIVE Final   Influenza B by PCR NEGATIVE NEGATIVE Final    Comment: (NOTE) The Xpert Xpress SARS-CoV-2/FLU/RSV plus assay is intended as an aid in the diagnosis of influenza from Nasopharyngeal swab specimens and should not be used as a sole basis for treatment. Nasal washings and aspirates are unacceptable for Xpert Xpress SARS-CoV-2/FLU/RSV testing.  Fact Sheet for Patients: EntrepreneurPulse.com.au  Fact Sheet for Healthcare Providers: IncredibleEmployment.be  This test is not yet approved or cleared by the [place] FDA and has been authorized for detection and/or diagnosis of SARS-CoV-2 by FDA under an Emergency Use Authorization (EUA). This EUA will remain in effect (meaning this test can be used) for the duration of the COVID-19 declaration under Section 564(b)(1) of the Act, 21 U.S.C. section 360bbb-3(b)(1), unless the authorization is terminated or revoked.  Performed at [facility], [address], [place], [place] [address]    Today   Cindy Hamilton today has no new complaints -No leg pains no leg swelling -No chest pains -Patient with fevers and cough -No significant abdominal concerns at this time  No Nausea, Vomiting or Diarrhea     Patient has been seen and examined prior to discharge   Objective   Blood  pressure (!) 112/56, pulse 92, temperature 98.7 F (37.1 C), temperature source Oral, resp. rate 19, height 5\' 10"  (1.778 m), weight 115.7 kg, last menstrual period 05/04/2022, SpO2 94 %.   Intake/Output Summary (Last 24 hours) at 05/18/2022 1003 Last data filed at 05/18/2022 0336 Gross per 24 hour  Intake 250.91 ml  Output --  Net 250.91 ml    Exam Gen:- Awake Alert, no acute distress , obese, not in any acute distress HEENT:- [place].AT, No sclera icterus Neck-Supple Neck,No JVD,.  Lungs-fair air movement, no wheezing  CV- S1, S2 normal, regular Abd-  +ve B.Sounds, Abd Soft, No significant tenderness, increased truncal adiposity    Extremity/Skin:- No  edema,   good pulses Psych-affect is appropriate, oriented x3 Neuro-no new focal deficits, no tremors    Data Review   CBC w Diff:  Lab Results  Component Value Date   WBC 7.4 05/18/2022   HGB 10.1 (L) 05/18/2022   HGB 12.2 10/19/2021   HCT 30.8 (L) 05/18/2022   HCT 37.9 10/19/2021   PLT 261 05/18/2022   PLT 277 10/19/2021   LYMPHOPCT 15 05/17/2022   MONOPCT 5 05/17/2022   EOSPCT 0 05/17/2022   BASOPCT 0 05/17/2022    CMP:  Lab Results  Component Value Date   NA 139 05/18/2022   NA 137 10/19/2021   K 3.3 (L) 05/18/2022   CL 106 05/18/2022   CO2 24 05/18/2022   BUN 7 05/18/2022   BUN 9 10/19/2021   CREATININE 0.55 05/18/2022   CREATININE 0.66 03/11/2022   PROT 6.6 05/18/2022   PROT 6.7 10/19/2021   ALBUMIN 3.4 (L) 05/18/2022   ALBUMIN 4.3 10/19/2021   BILITOT 0.7 05/18/2022   BILITOT 0.3 10/19/2021   ALKPHOS 70 05/18/2022   AST 25 05/18/2022   ALT 35 05/18/2022  .  Total Discharge time is about 33 minutes  Roxan Hockey M.D on 05/18/2022 at 10:03 AM  Go to [email or web address] -  for contact info  [facility] - Office  [phone]

## 2022-05-20 NOTE — Progress Notes (Signed)
I updated patient that pathology showed stones and chronic cholecystitis. Talked to her this AM about her ED visit/ hospital admission For PNA. She is still having fever and coughing but no major issues or pain with abdomen except with coughing. She is going to monitor her symptoms and call with concerns.

## 2022-05-22 ENCOUNTER — Other Ambulatory Visit (HOSPITAL_COMMUNITY): Payer: Self-pay

## 2022-05-24 ENCOUNTER — Telehealth (INDEPENDENT_AMBULATORY_CARE_PROVIDER_SITE_OTHER): Payer: 59 | Admitting: General Surgery

## 2022-05-24 DIAGNOSIS — Z09 Encounter for follow-up examination after completed treatment for conditions other than malignant neoplasm: Secondary | ICD-10-CM

## 2022-05-24 MED ORDER — FLUCONAZOLE 150 MG PO TABS: 150.0000 mg | ORAL_TABLET | Freq: Every day | ORAL | 0 refills | Status: AC

## 2022-05-24 NOTE — Telephone Encounter (Signed)
Diflucan sent in for yeast.   Curlene Labrum, MD

## 2022-05-27 NOTE — Telephone Encounter (Signed)
Noted  

## 2022-05-29 ENCOUNTER — Ambulatory Visit (INDEPENDENT_AMBULATORY_CARE_PROVIDER_SITE_OTHER): Payer: 59 | Admitting: General Surgery

## 2022-05-29 DIAGNOSIS — K8020 Calculus of gallbladder without cholecystitis without obstruction: Secondary | ICD-10-CM

## 2022-05-29 NOTE — Progress Notes (Signed)
[facility]  I am talking to the patient for post operative evaluation. This is not a billable encounter as it is under the [place] charges for the surgery.  The patient had a laparoscopic cholecystectomy on 10/18. The patient reports that she is doing well now. She did have to go to the ED the weekend after for a severe cough and was diagnosis with PNA and received antibiotics. Her LFTs and abdomen were benign at that time. She is feeling better. The are tolerating a diet, having good pain control, and having regular Bms.  The incisions are healing. The patient has no concerns.   Pathology: A. GALLBLADDER, CHOLECYSTECTOMY:  - Chronic cholecystitis and cholelithiasis  Will see the patient PRN.   Curlene Labrum, MD [facility] [address] [place], [place] [address] [phone] (office)

## 2022-05-30 DIAGNOSIS — F411 Generalized anxiety disorder: Secondary | ICD-10-CM | POA: Diagnosis not present

## 2022-06-13 ENCOUNTER — Ambulatory Visit (INDEPENDENT_AMBULATORY_CARE_PROVIDER_SITE_OTHER): Payer: 59 | Admitting: Neurology

## 2022-06-13 DIAGNOSIS — G4733 Obstructive sleep apnea (adult) (pediatric): Secondary | ICD-10-CM

## 2022-06-13 DIAGNOSIS — J4520 Mild intermittent asthma, uncomplicated: Secondary | ICD-10-CM

## 2022-06-13 DIAGNOSIS — G4719 Other hypersomnia: Secondary | ICD-10-CM

## 2022-06-13 DIAGNOSIS — R0683 Snoring: Secondary | ICD-10-CM | POA: Diagnosis not present

## 2022-06-13 DIAGNOSIS — Z820 Family history of epilepsy and other diseases of the nervous system: Secondary | ICD-10-CM

## 2022-06-13 DIAGNOSIS — Z9189 Other specified personal risk factors, not elsewhere classified: Secondary | ICD-10-CM

## 2022-06-13 DIAGNOSIS — R9431 Abnormal electrocardiogram [ECG] [EKG]: Secondary | ICD-10-CM

## 2022-06-13 DIAGNOSIS — F411 Generalized anxiety disorder: Secondary | ICD-10-CM | POA: Diagnosis not present

## 2022-06-13 DIAGNOSIS — E669 Obesity, unspecified: Secondary | ICD-10-CM

## 2022-06-13 DIAGNOSIS — F988 Other specified behavioral and emotional disorders with onset usually occurring in childhood and adolescence: Secondary | ICD-10-CM

## 2022-06-13 DIAGNOSIS — G478 Other sleep disorders: Secondary | ICD-10-CM

## 2022-06-18 ENCOUNTER — Other Ambulatory Visit (HOSPITAL_COMMUNITY): Payer: Self-pay

## 2022-06-18 ENCOUNTER — Other Ambulatory Visit: Payer: Self-pay | Admitting: Family Medicine

## 2022-06-18 MED ORDER — METHYLPHENIDATE HCL ER (CD) 20 MG PO CPCR
20.0000 mg | ORAL_CAPSULE | ORAL | 0 refills | Status: DC
  Filled 2022-06-18 – 2022-07-30 (×2): qty 30, 30d supply, fill #0

## 2022-06-18 MED ORDER — METHYLPHENIDATE HCL ER (CD) 20 MG PO CPCR
20.0000 mg | ORAL_CAPSULE | ORAL | 0 refills | Status: DC
  Filled 2022-06-18 – 2022-06-24 (×2): qty 30, 30d supply, fill #0

## 2022-06-19 ENCOUNTER — Other Ambulatory Visit (HOSPITAL_COMMUNITY): Payer: Self-pay

## 2022-06-19 NOTE — Procedures (Signed)
Physician Interpretation:     [facility] POLYSOMNOGRAPHY  INTERPRETATION REPORT   STUDY DATE:  06/13/2022     PATIENT NAME:  Cindy Hamilton         DATE OF BIRTH:  July 10, 1986  PATIENT ID:  [id number]    TYPE OF STUDY:  PSG  READING PHYSICIAN:  Huston Foley, MD, PhD REFERRED BY: Dr. Gerda Diss SCORING TECHNICIAN: Domingo Cocking, RPSGT  HISTORY:  36 year old female with a history of reflux disease, ADD, asthma, anemia, anxiety, depression, obesity, and recent s/p lap chole, who reports snoring and excessive daytime somnolence. Her Epworth sleepiness score despite this is 14 out of 24, fatigue severity score is 25 out of 63.  Height: 70 in Weight: 260 lb (BMI 37) Neck Size: 15 in  MEDICATIONS: Ventolin HFA, Vitaamin D3, Pepcid, Ferrous Sulfate, Prozac, Flovent HFA, Metaadate CD, Valtrex TECHNICAL DESCRIPTION: A registered sleep technologist was in attendance for the duration of the recording.  Data collection, scoring, video monitoring, and reporting were performed in compliance with the AASM Manual for the Scoring of Sleep and Associated Events; (Hypopnea is scored based on the criteria listed in Section VIII D. 1b in the AASM Manual V2.6 using a 4% oxygen desaturation rule or Hypopnea is scored based on the criteria listed in Section VIII D. 1a in the AASM Manual V2.6 using 3% oxygen desaturation and /or arousal rule).   SLEEP CONTINUITY AND SLEEP ARCHITECTURE:  Lights-out was at 22:19: and lights-on at  04:52:, with a total recording time of 6 hours, 33 min. Total sleep time ( TST) was 369.0 minutes with a normal sleep efficiency at 93.9%.  BODY POSITION:  TST was divided  between the following sleep positions: 100.0% supine;  0.0% lateral;  0% prone. Duration of total sleep and percent of total sleep in their respective position is as follows: supine 369 minutes (100%), non-supine 0 minutes (0%); right 00 minutes (0%), left 00 minutes (0%), and prone 00  minutes (0%).  Total supine REM sleep time was 97 minutes (100% of total REM sleep).  Sleep latency was normal at 19.0 minutes.  REM sleep latency was mildly increased at 123.5 minutes. Of the total sleep time, the percentage of stage N1 sleep was 1.4%, stage N2 sleep was 65%, which is increased, stage N3 sleep was 7.2%, which is reduced, and REM sleep was 26.4% which is slightly increased. Wake after sleep onset (WASO) time accounted for 4.5 minutes without any significant sleep fragmentation.   RESPIRATORY MONITORING:  Based on CMS criteria (using a 4% oxygen desaturation rule for scoring hypopneas), there were 21 apneas (21 obstructive; 0 central; 0 mixed), and 4 hypopneas.  Apnea index was 3.4. Hypopnea index was 0.7. The apnea-hypopnea index was 4.1 overall (4.1 supine, 0 non-supine; 12.9 REM, 12.9 supine REM).  There were 1 respiratory effort-related arousals (RERAs).  The RERA index was 0 events/h. Total respiratory disturbance index (RDI) was 4.2 events/h. RDI results showed: supine RDI  4.2 /h; non-supine RDI 0.0 /h; REM RDI 12.9 /h, supine REM RDI 12.9 /h.   Based on AASM criteria (using a 3% oxygen desaturation and /or arousal rule for scoring hypopneas), there were 21 apneas (21 obstructive; 0 central; 0 mixed), and 16 hypopneas. Apnea index was 3.4. Hypopnea index was 2.6. The apnea-hypopnea index was 6.0/hour overall (6.0 supine, 0 non-supine; 19.7 REM, 19.7 supine REM).  There were 1 respiratory effort-related arousals (RERAs).  The RERA index was 0 events/h. Total respiratory disturbance index (RDI) was  6.2 events/h. RDI results showed: supine RDI  6.2 /h; non-supine RDI 0.0 /h; REM RDI 19.7 /h, supine REM RDI 19.7 /h.  OXIMETRY: Oxyhemoglobin Saturation Nadir during sleep was at  89%) from a mean of 96%.  Of the Total sleep time (TST)   hypoxemia (=<88%) was present for  0.2 minutes, or 0.0% of total sleep time.  LIMB MOVEMENTS: There were 0 periodic limb movements of sleep (0.0/hr), of  which 0 (0.0/hr) were associated with an arousal. AROUSAL: There were 39 arousals in total, for an arousal index of 6 arousals/hour.  Of these, 2 were identified as respiratory-related arousals (0 /h), 0 were PLM-related arousals (0 /h), and 24 were non-specific arousals (4 /h). EEG: Review of the EEG showed no abnormal electrical discharges and symmetrical bihemispheric findings.   EKG: The EKG revealed normal sinus rhythm (NSR).  PVCs were noted. The average heart rate during sleep was 72 bpm. AUDIO/VIDEO REVIEW: The audio and video review did not show any abnormal or unusual behaviors, movements, phonations or vocalizations. The patient took no restroom breaks. Mild snoring was noted.   POST-STUDY QUESTIONNAIRE: Post study, the patient indicated, that sleep was the same as usual. IMPRESSION:  1. Mild obstructive sleep apnea (OSA) 2. Non-specific abnormal electrocardiogram (EKG)  RECOMMENDATIONS:  1. This study demonstrates overall mild/borderline obstructive sleep apnea, with a total AHI of 6/hour, REM AHI of 19.7/hour, and O2 nadir of 89%. Given the patient's medical history and sleep related complaints, treatment with positive airway pressure is recommended; this can be achieved in the form of autoPAP. Alternatively, a full-night CPAP titration study would allow optimization of therapy if needed. Other treatment options may include avoidance of supine sleep position along with weight loss, or the use of an oral appliance in selected patients. Please note, that untreated obstructive sleep apnea may carry additional perioperative morbidity. Patients with significant obstructive sleep apnea should receive perioperative PAP therapy and the surgeons and particularly the anesthesiologist should be informed of the diagnosis and the severity of the sleep disordered breathing.  Concomitant weight loss is recommended. 2. The study showed PVCs on single lead EKG; clinical correlation is recommended and  consultation with cardiology may be feasible.  3. The patient should be cautioned not to drive, work at heights, or operate dangerous or heavy equipment when tired or sleepy. Review and reiteration of good sleep hygiene measures should be pursued with any patient. 5. The patient will [facility] of the test results and further management strategies. The referring provider will be notified of the test results.   I certify that I have reviewed the entire raw data recording prior to the issuance of this report in accordance with the Standards of Accreditation of the American Academy of Sleep Medicine (AASM). Huston FoleySaima Azaiah Mello, MD, PhD [facility] Diplomat, ABPN (Neurology and Sleep)              Technical Report:   General Information  Name: Cindy Hamilton, Cindy Hamilton BMI: 37.31 Physician: , , MD  ID: [id number] Height: 70.0 in Technician:  , RPSGT  Sex: Female Weight: 260.0 lb Record: [id number]  Age: [age] [05/24/1986] Date: 06/13/2022    Medical & Medication History    Ms. Cindy Hamilton is a 36 year old female with an underlying medical history of reflux disease, ADD, asthma, anemia, anxiety, depression, and obesity, who reports snoring and excessive daytime somnolence. Her weight has been fluctuating. She has had stress in the recent past but  with her husband's medical condition. She works full-time as a Systems analyst. She denies night to night nocturia or recurrent morning or nocturnal headaches. Her father has sleep apnea. She drinks caffeine in the form of coffee, 1 or 2 cups/day, she is a non-smoker and does not currently consume any alcohol. She lives with her spouse, they have 1 dog in the household, they do not have a TV in the bedroom. She goes to bed generally between 10 and 11 and rise time is between 6 and 6:30 AM. She has a history of sleep talking for her husband. She did have sleep talking as a child as well. She had recent  testing for attention deficit and was found to have residual adult attention deficit disorder. She is on long-acting methylphenidate. Her Epworth sleepiness score despite this is 14 out of 24, fatigue severity score is 25 out of 63.  Ventolin HFA, Vitaamin D3, Pepcid, Ferrous Sulfate, Prozac, Flovent HFA, Metaadate CD, Valtrex   Sleep Disorder      Comments   Patient arrived for a diagnostic polysomnogram. Procedure explained and all questions answered. Standard paste setup without complications. Patient slept supine. Mild snoring was heard. Respiratory events observed, primarily in REM sleep. Frequent PVC's noted. No significant PLMS observed. Patient had no restroom visit.    Lights out: 10:19:07 PM Lights on: 04:52:23 AM   Time Total Supine Side Prone Upright  Recording (TRT) 6h 33.43m 6h 33.61m 0h 0.16m 0h 0.73m 0h 0.57m  Sleep (TST) 6h 9.81m 6h 9.26m 0h 0.45m 0h 0.79m 0h 0.31m   Latency N1 N2 N3 REM Onset Per. Slp. Eff.  Actual 0h 0.71m 0h 2.72m 0h 25.69m 2h 3.44m 0h 19.76m 0h 25.86m 94.02%   Stg Dur Wake N1 N2 N3 REM  Total 23.5 5.0 240.0 26.5 97.5  Supine 23.5 5.0 240.0 26.5 97.5  Side 0.0 0.0 0.0 0.0 0.0  Prone 0.0 0.0 0.0 0.0 0.0  Upright 0.0 0.0 0.0 0.0 0.0   Stg % Wake N1 N2 N3 REM  Total 6.0 1.4 65.0 7.2 26.4  Supine 6.0 1.4 65.0 7.2 26.4  Side 0.0 0.0 0.0 0.0 0.0  Prone 0.0 0.0 0.0 0.0 0.0  Upright 0.0 0.0 0.0 0.0 0.0     Apnea Summary Sub Supine Side Prone Upright  Total 21 Total 21 21 0 0 0    REM 17 17 0 0 0    NREM 4 4 0 0 0  Obs 21 REM 17 17 0 0 0    NREM 4 4 0 0 0  Mix 0 REM 0 0 0 0 0    NREM 0 0 0 0 0  Cen 0 REM 0 0 0 0 0    NREM 0 0 0 0 0   Rera Summary Sub Supine Side Prone Upright  Total 1 Total 1 1 0 0 0    REM 0 0 0 0 0    NREM 1 1 0 0 0   Hypopnea Summary Sub Supine Side Prone Upright  Total 16 Total 16 16 0 0 0    REM 15 15 0 0 0    NREM 1 1 0 0 0   4% Hypopnea Summary Sub Supine Side Prone Upright  Total (4%) 4 Total 4 4 0 0 0    REM 4 4 0 0 0    NREM  0 0 0 0 0     AHI Total Obs Mix Cen  6.01 Apnea 3.41 3.41 0.00 0.00  Hypopnea 2.60 -- -- --  4.06 Hypopnea (4%) 0.65 -- -- --    Total Supine Side Prone Upright  Position AHI 6.01 6.01 0.00 0.00 0.00  REM AHI 19.69   NREM AHI 1.10   Position RDI 6.17 6.17 0.00 0.00 0.00  REM RDI 19.69   NREM RDI 1.33    4% Hypopnea Total Supine Side Prone Upright  Position AHI (4%) 4.06 4.06 0.00 0.00 0.00  REM AHI (4%) 12.92   NREM AHI (4%) 0.88   Position RDI (4%) 4.22 4.22 0.00 0.00 0.00  REM RDI (4%) 12.92   NREM RDI (4%) 1.10    Desaturation Information Threshold: 2% <100% <90% <80% <70% <60% <50% <40%  Supine 117.0 2.0 1.0 1.0 1.0 0.0 0.0  Side 0.0 0.0 0.0 0.0 0.0 0.0 0.0  Prone 0.0 0.0 0.0 0.0 0.0 0.0 0.0  Upright 0.0 0.0 0.0 0.0 0.0 0.0 0.0  Total 117.0 2.0 1.0 1.0 1.0 0.0 0.0  Index 18.8 0.3 0.2 0.2 0.2 0.0 0.0   Threshold: 3% <100% <90% <80% <70% <60% <50% <40%  Supine 50.0 2.0 1.0 1.0 1.0 0.0 0.0  Side 0.0 0.0 0.0 0.0 0.0 0.0 0.0  Prone 0.0 0.0 0.0 0.0 0.0 0.0 0.0  Upright 0.0 0.0 0.0 0.0 0.0 0.0 0.0  Total 50.0 2.0 1.0 1.0 1.0 0.0 0.0  Index 8.0 0.3 0.2 0.2 0.2 0.0 0.0   Threshold: 4% <100% <90% <80% <70% <60% <50% <40%  Supine 22.0 2.0 1.0 1.0 1.0 0.0 0.0  Side 0.0 0.0 0.0 0.0 0.0 0.0 0.0  Prone 0.0 0.0 0.0 0.0 0.0 0.0 0.0  Upright 0.0 0.0 0.0 0.0 0.0 0.0 0.0  Total 22.0 2.0 1.0 1.0 1.0 0.0 0.0  Index 3.5 0.3 0.2 0.2 0.2 0.0 0.0   Threshold: 3% <100% <90% <80% <70% <60% <50% <40%  Supine 50 2 1 1 1  0 0  Side 0 0 0 0 0 0 0  Prone 0 0 0 0 0 0 0  Upright 0 0 0 0 0 0 0  Total 50 2 1 1 1  0 0   Awakening/Arousal Information # of Awakenings 8  Wake after sleep onset 4.70m  Wake after persistent sleep 4.57m   Arousal Assoc. Arousals Index  Apneas 2 0.3  Hypopneas 0 0.0  Leg Movements 17 2.8  Snore 0 0.0  PTT Arousals 0 0.0  Spontaneous 24 3.9  Total 44 7.1  Leg Movement Information PLMS LMs Index  Total LMs during PLMS 0 0.0  LMs w/ Microarousals 0 0.0   LM  LMs Index  w/ Microarousal 17 2.8  w/ Awakening 4 0.6  w/ Resp Event 0 0.0  Spontaneous 11 1.8  Total 28 4.5     Desaturation threshold setting: 3% Minimum desaturation setting: 10 seconds SaO2 nadir: 89% The longest event was a 38 sec obstructive Hypopnea with a minimum SaO2 of 91%. The lowest SaO2 was 50% associated with a 16 sec obstructive Apnea. EKG Rates EKG Avg Max Min  Awake 78 91 60  Asleep 72 90 57  EKG Events: N/A

## 2022-06-24 ENCOUNTER — Other Ambulatory Visit (HOSPITAL_COMMUNITY): Payer: Self-pay

## 2022-06-24 NOTE — Addendum Note (Signed)
Addended by: Huston Foley on: 06/24/2022 02:01 PM   Modules accepted: Orders

## 2022-06-26 ENCOUNTER — Telehealth: Payer: Self-pay | Admitting: Neurology

## 2022-06-26 NOTE — Telephone Encounter (Signed)
I called pt. I advised pt that Dr. Frances Furbish reviewed their sleep study results and found that pt overall has mild OSA. Dr. Frances Furbish recommends that pt starts auto CPAP to see if she notes benefit. I reviewed PAP compliance expectations with the pt. Pt is agreeable to starting a CPAP. Pt has new insurance with [employer] starting in January and she would like to hold off on starting the new machine until she gets the new insurance. Advised if going through [place] we can send the orders to [facility] at that time. She will send [place] a reminder first of the year, along with her insurance cards so that we can at that point forward the orders to [facility] with her new insurance.  Instructed the patient on compliance guidelines once starting the CPAP and the need for a 31-90 day initial cpap follow up visit. Pt verbalized understanding. Pt had no questions at this time but was encouraged to call back if questions arise.

## 2022-06-26 NOTE — Telephone Encounter (Signed)
-----   Message from Huston Foley, MD sent at 06/24/2022  2:01 PM EST ----- Patient referred by Dr. Gerda Diss, seen by me on 03/20/2022, diagnostic PSG on 06/13/2022.    Please call and notify the patient that the recent sleep study did confirm the diagnosis of obstructive sleep apnea. OSA is overall mild, but worth treating to see if she feels better after treatment. To that end I recommend treatment for this in the form of autoPAP, which means, that we don't have to bring her back for a second sleep study with CPAP, but will let him try an autoPAP machine at home, through a DME company (of her choice, or as per insurance requirement). The DME representative will educate her on how to use the machine, how to put the mask on, etc. I have placed an order in the chart. Please send referral, talk to patient, send report to referring MD. We will need a FU in sleep clinic for 10 weeks post-PAP set up, please arrange that with me or one of our NPs. Thanks,   Huston Foley, MD, PhD [facility])

## 2022-06-27 DIAGNOSIS — F411 Generalized anxiety disorder: Secondary | ICD-10-CM | POA: Diagnosis not present

## 2022-07-04 ENCOUNTER — Other Ambulatory Visit (HOSPITAL_COMMUNITY): Payer: Self-pay

## 2022-07-17 ENCOUNTER — Encounter: Payer: Self-pay | Admitting: Family Medicine

## 2022-07-17 DIAGNOSIS — D509 Iron deficiency anemia, unspecified: Secondary | ICD-10-CM

## 2022-07-17 DIAGNOSIS — R631 Polydipsia: Secondary | ICD-10-CM

## 2022-07-17 DIAGNOSIS — R5383 Other fatigue: Secondary | ICD-10-CM

## 2022-07-17 DIAGNOSIS — R635 Abnormal weight gain: Secondary | ICD-10-CM

## 2022-07-18 NOTE — Telephone Encounter (Signed)
Nurses Please go ahead and order CBC, ferritin, TIBC also CMP and vitamin D as well as A1c  Diagnosis-fatigue, anemia, excessive thirst, weight gain  Please inform [place]  [place]-the nurses have put in the orders for your lab work.  We look forward to seeing you in January.  Corrie Dandy Christmas and happy holidays

## 2022-07-30 ENCOUNTER — Other Ambulatory Visit (HOSPITAL_COMMUNITY): Payer: Self-pay

## 2022-07-31 ENCOUNTER — Other Ambulatory Visit: Payer: Self-pay

## 2022-07-31 ENCOUNTER — Other Ambulatory Visit: Payer: Self-pay | Admitting: Internal Medicine

## 2022-07-31 ENCOUNTER — Other Ambulatory Visit (HOSPITAL_COMMUNITY): Payer: Self-pay

## 2022-07-31 ENCOUNTER — Encounter: Payer: Self-pay | Admitting: Family Medicine

## 2022-08-01 NOTE — Telephone Encounter (Signed)
Nurses Please move out 1 additional week January 17 would be fine at 4:10 PM-that time seems to work best for her schedule thank you  Obviously we hope Cindy Hamilton feels better and if any trouble for her to let us know thanks

## 2022-08-01 NOTE — Telephone Encounter (Signed)
Unable to refill per protocol, last refill by provider no longer at the practice. Another PCP listed, will refuse.  Requested Prescriptions  Pending Prescriptions Disp Refills   valACYclovir (VALTREX) 1000 MG tablet 60 tablet 0    Sig: Take 1 tablet by mouth twice daily     Antimicrobials:  Antiviral Agents - Anti-Herpetic Passed - 07/31/2022  5:07 PM      Passed - Valid encounter within last 12 months    Recent Outpatient Visits           10 months ago Sleep apnea, unspecified type   [facility] Vigg, Avanti, MD   1 year ago Attention deficit disorder, unspecified hyperactivity presence   [place] Vigg, Avanti, MD   1 year ago Anemia, unspecified type   [place], MD       Future Appointments             In 6 days Luking, Elayne Snare, MD Shubert, Maxwell

## 2022-08-07 ENCOUNTER — Ambulatory Visit: Payer: 59 | Admitting: Family Medicine

## 2022-08-14 ENCOUNTER — Ambulatory Visit: Payer: 59 | Admitting: Family Medicine

## 2022-08-14 DIAGNOSIS — F988 Other specified behavioral and emotional disorders with onset usually occurring in childhood and adolescence: Secondary | ICD-10-CM

## 2022-08-14 DIAGNOSIS — F324 Major depressive disorder, single episode, in partial remission: Secondary | ICD-10-CM

## 2022-08-14 MED ORDER — FLUOXETINE HCL 20 MG PO CAPS
20.0000 mg | ORAL_CAPSULE | Freq: Every day | ORAL | 3 refills | Status: DC
  Filled 2022-08-14 – 2022-09-24 (×2): qty 90, 90d supply, fill #0
  Filled 2022-12-20: qty 90, 90d supply, fill #1
  Filled 2023-04-04: qty 90, 90d supply, fill #2
  Filled 2023-07-07: qty 90, 90d supply, fill #3

## 2022-08-14 MED ORDER — METHYLPHENIDATE HCL ER (CD) 20 MG PO CPCR
20.0000 mg | ORAL_CAPSULE | ORAL | 0 refills | Status: DC
  Filled 2022-08-14 – 2022-08-30 (×2): qty 30, 30d supply, fill #0

## 2022-08-14 MED ORDER — METHYLPHENIDATE HCL ER (CD) 20 MG PO CPCR
20.0000 mg | ORAL_CAPSULE | ORAL | 0 refills | Status: DC
  Filled 2022-08-14 – 2022-09-30 (×2): qty 30, 30d supply, fill #0

## 2022-08-14 MED ORDER — VALACYCLOVIR HCL 1 G PO TABS
ORAL_TABLET | ORAL | 2 refills | Status: AC
  Filled 2022-08-14: qty 60, 15d supply, fill #0

## 2022-08-14 MED ORDER — METHYLPHENIDATE HCL ER (CD) 20 MG PO CPCR
20.0000 mg | ORAL_CAPSULE | ORAL | 0 refills | Status: DC
  Filled 2022-08-14 – 2022-10-30 (×2): qty 30, 30d supply, fill #0

## 2022-08-14 NOTE — Progress Notes (Addendum)
   Subjective:    Patient ID: Cindy Hamilton, female    DOB: [birth year], 37 y.o.   MRN: [id number]  HPI This patient has adult ADD. Takes medication responsibly. Medication does help the patient focus in be more functional. Patient relates that they are or not abusing the medication or misusing the medication. The patient understands that if they're having any negative side effects such as elevated high blood pressure severe headaches they would need stop the medication follow-up immediately. They also understand that the prescriptions are to last for 3 months then the patient will need to follow-up before having further prescriptions.    08/14/2022    4:19 PM 10/02/2021    8:19 AM 02/15/2021    8:30 AM  PHQ9 SCORE ONLY  PHQ-9 Total Score 3 5 4     Patient compliance good compliance  Does medication help patient function /attention better does help her function better  Side effects denies side effects   Review of Systems     Objective:   Physical Exam General-in no acute distress Eyes-no discharge Lungs-respiratory rate normal, CTA CV-no murmurs,RRR Extremities skin warm dry no edema Neuro grossly normal Behavior normal, alert        Assessment & Plan:  1. Morbid obesity ([place]) Portion control Regular physical activity She is going to try to do stationary bicycling 3 times a week in the morning to help her She is going to preprepared her breakfast on a regular basis Should try to make better choices at lunchtime at work And she will try to limit her sweets to no more than 3 times per week  Patient is not interested in GLP-1's 2. Attention deficit disorder (ADD) without hyperactivity Continue medication does help her 3 prescription sent and she will let us know when she needs a fourth 1  Major depression partial remission continue Prozac She plans on going to partial time later this year I think that would be beneficial She has an extremely stressful job with severe  amount of hours associated with it also her husband has significant health issues with his kidneys in order to maximize her overall enjoyment it would be best for her to cut back to part-time continue current medication

## 2022-08-15 ENCOUNTER — Other Ambulatory Visit (HOSPITAL_COMMUNITY): Payer: Self-pay

## 2022-08-15 ENCOUNTER — Other Ambulatory Visit: Payer: Self-pay

## 2022-08-28 ENCOUNTER — Other Ambulatory Visit (HOSPITAL_COMMUNITY): Payer: Self-pay

## 2022-08-30 ENCOUNTER — Other Ambulatory Visit (HOSPITAL_COMMUNITY): Payer: Self-pay

## 2022-08-30 ENCOUNTER — Other Ambulatory Visit: Payer: Self-pay

## 2022-09-06 DIAGNOSIS — R5383 Other fatigue: Secondary | ICD-10-CM | POA: Diagnosis not present

## 2022-09-06 DIAGNOSIS — R631 Polydipsia: Secondary | ICD-10-CM | POA: Diagnosis not present

## 2022-09-06 DIAGNOSIS — R635 Abnormal weight gain: Secondary | ICD-10-CM | POA: Diagnosis not present

## 2022-09-06 DIAGNOSIS — D509 Iron deficiency anemia, unspecified: Secondary | ICD-10-CM | POA: Diagnosis not present

## 2022-09-07 LAB — COMPREHENSIVE METABOLIC PANEL
ALT: 18 IU/L (ref 0–32)
AST: 19 IU/L (ref 0–40)
Albumin/Globulin Ratio: 1.9 (ref 1.2–2.2)
Albumin: 4.3 g/dL (ref 3.9–4.9)
Alkaline Phosphatase: 97 IU/L (ref 44–121)
BUN/Creatinine Ratio: 11 (ref 9–23)
BUN: 7 mg/dL (ref 6–20)
Bilirubin Total: 0.2 mg/dL (ref 0.0–1.2)
CO2: 24 mmol/L (ref 20–29)
Calcium: 9.3 mg/dL (ref 8.7–10.2)
Chloride: 102 mmol/L (ref 96–106)
Creatinine, Ser: 0.63 mg/dL (ref 0.57–1.00)
Globulin, Total: 2.3 g/dL (ref 1.5–4.5)
Glucose: 93 mg/dL (ref 70–99)
Potassium: 4.4 mmol/L (ref 3.5–5.2)
Sodium: 138 mmol/L (ref 134–144)
Total Protein: 6.6 g/dL (ref 6.0–8.5)
eGFR: 119 mL/min/{1.73_m2} (ref >59)

## 2022-09-07 LAB — IRON,TIBC AND FERRITIN PANEL
Ferritin: 37 ng/mL (ref 15–150)
Iron Saturation: 13 % — ABNORMAL LOW (ref 15–55)
Iron: 46 ug/dL (ref 27–159)
Total Iron Binding Capacity: 346 ug/dL (ref 250–450)
UIBC: 300 ug/dL (ref 131–425)

## 2022-09-07 LAB — CBC WITH DIFFERENTIAL/PLATELET
Basophils Absolute: 0 10*3/uL (ref 0.0–0.2)
Basos: 1 %
EOS (ABSOLUTE): 0.1 10*3/uL (ref 0.0–0.4)
Eos: 2 %
Hematocrit: 36.1 % (ref 34.0–46.6)
Hemoglobin: 11.8 g/dL (ref 11.1–15.9)
Immature Grans (Abs): 0 10*3/uL (ref 0.0–0.1)
Immature Granulocytes: 0 %
Lymphocytes Absolute: 2.2 10*3/uL (ref 0.7–3.1)
Lymphs: 38 %
MCH: 26.6 pg (ref 26.6–33.0)
MCHC: 32.7 g/dL (ref 31.5–35.7)
MCV: 81 fL (ref 79–97)
Monocytes Absolute: 0.4 10*3/uL (ref 0.1–0.9)
Monocytes: 7 %
Neutrophils Absolute: 3.1 10*3/uL (ref 1.4–7.0)
Neutrophils: 52 %
Platelets: 306 10*3/uL (ref 150–450)
RBC: 4.44 x10E6/uL (ref 3.77–5.28)
RDW: 14.5 % (ref 11.7–15.4)
WBC: 6 10*3/uL (ref 3.4–10.8)

## 2022-09-07 LAB — HEMOGLOBIN A1C
Est. average glucose Bld gHb Est-mCnc: 117 mg/dL
Hgb A1c MFr Bld: 5.7 % — ABNORMAL HIGH (ref 4.8–5.6)

## 2022-09-07 LAB — VITAMIN D 25 HYDROXY (VIT D DEFICIENCY, FRACTURES): Vit D, 25-Hydroxy: 33 ng/mL (ref 30.0–100.0)

## 2022-09-16 ENCOUNTER — Telehealth: Payer: Self-pay | Admitting: Family Medicine

## 2022-09-16 ENCOUNTER — Encounter: Payer: Self-pay | Admitting: Family Medicine

## 2022-09-16 NOTE — Telephone Encounter (Signed)
Will call [place] regarding her MyChart message

## 2022-09-16 NOTE — Telephone Encounter (Signed)
Patient has underlying ADD.  Already on medications.  The medication has already been adjusted to the maximal tolerable dose for the patient.  The patient previously has done some counseling episodes with her counselor for ADD related issues and is seen little improvement in being able to handle multiple stimuli.  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  Because of her ADD is hard to assimilate all of these details in the evening time with documentation so therefore it is taking a lot longer.  Patient is feeling overwhelmed.  She denies being depressed.  Her workplace has stated that if she has a letter they can do some accommodations.  So therefore we will move toward doing a letter to help with accommodations

## 2022-09-17 ENCOUNTER — Encounter: Payer: Self-pay | Admitting: Family Medicine

## 2022-09-17 NOTE — Telephone Encounter (Signed)
Front-I did do a letter for this patient-she is requesting that it be forwarded to her human resources please see her MyChart message-thank you  Please also send the patient a MyChart message letting her know that this is being handled thank you

## 2022-09-17 NOTE — Progress Notes (Signed)
Front-please see MyChart message regarding sending this letter to human resources.

## 2022-09-17 NOTE — Telephone Encounter (Signed)
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  We did do a letter.  We will forward as requested.  She will continue to follow-up here every 3 months - 4 months.  And also continue current dosing and the medicine which she states is helping.  Front-please see the note that was dictated plus also the message from [place].  Please have business staff for this letter to the human resources at the stated addresses plus also send Wells Guiles a notification that this is being completed thank you

## 2022-09-24 ENCOUNTER — Other Ambulatory Visit (HOSPITAL_COMMUNITY): Payer: Self-pay

## 2022-09-27 ENCOUNTER — Telehealth: Payer: Self-pay

## 2022-09-27 NOTE — Telephone Encounter (Signed)
Patient dropped off document  Medical form dropped off , to be filled out by provider. Patient requested to send it via Call Patient to pick up within 7-days. Document is located in providers tray at front office.

## 2022-09-29 NOTE — Telephone Encounter (Signed)
Form was filled out.  Please forward to the patient

## 2022-09-30 ENCOUNTER — Other Ambulatory Visit (HOSPITAL_COMMUNITY): Payer: Self-pay

## 2022-09-30 NOTE — Telephone Encounter (Signed)
Error

## 2022-09-30 NOTE — Telephone Encounter (Signed)
Left message forms are ready for pick-up. And $20 charge for [facility]

## 2022-10-01 ENCOUNTER — Other Ambulatory Visit: Payer: Self-pay

## 2022-10-01 ENCOUNTER — Telehealth: Payer: Self-pay | Admitting: Family Medicine

## 2022-10-01 NOTE — Telephone Encounter (Signed)
Ashley-with [place] sent over  form that needs to be completed on patient that was sent in. Please read highlighted area this needs to be completed and sent back before 3/8  this was the Matrix you filled out.Where you make through on last sheet it needs to be completed.

## 2022-10-01 NOTE — Telephone Encounter (Signed)
This form was completed stating that her accommodation would need to be permanent Please fax this over Please scan that particular page into her chart thank you so much-Dr. Sallee Lange

## 2022-10-30 ENCOUNTER — Other Ambulatory Visit: Payer: Self-pay

## 2022-10-30 ENCOUNTER — Other Ambulatory Visit (HOSPITAL_COMMUNITY): Payer: Self-pay

## 2022-12-03 ENCOUNTER — Other Ambulatory Visit (HOSPITAL_COMMUNITY): Payer: Self-pay

## 2022-12-03 ENCOUNTER — Other Ambulatory Visit: Payer: Self-pay | Admitting: Family Medicine

## 2022-12-04 ENCOUNTER — Other Ambulatory Visit (HOSPITAL_COMMUNITY): Payer: Self-pay

## 2022-12-04 ENCOUNTER — Encounter: Payer: Self-pay | Admitting: Family Medicine

## 2022-12-05 ENCOUNTER — Other Ambulatory Visit: Payer: Self-pay | Admitting: Family Medicine

## 2022-12-05 ENCOUNTER — Other Ambulatory Visit (HOSPITAL_COMMUNITY): Payer: Self-pay

## 2022-12-05 MED ORDER — METHYLPHENIDATE HCL ER (CD) 20 MG PO CPCR
20.0000 mg | ORAL_CAPSULE | ORAL | 0 refills | Status: DC
  Filled 2022-12-05: qty 30, 30d supply, fill #0

## 2022-12-18 ENCOUNTER — Ambulatory Visit: Payer: 59 | Admitting: Family Medicine

## 2022-12-18 VITALS — BP 124/76 | HR 84 | Temp 97.6°F | Ht 70.0 in | Wt 262.2 lb

## 2022-12-18 DIAGNOSIS — N62 Hypertrophy of breast: Secondary | ICD-10-CM | POA: Insufficient documentation

## 2022-12-18 DIAGNOSIS — F988 Other specified behavioral and emotional disorders with onset usually occurring in childhood and adolescence: Secondary | ICD-10-CM | POA: Diagnosis not present

## 2022-12-18 DIAGNOSIS — J4520 Mild intermittent asthma, uncomplicated: Secondary | ICD-10-CM | POA: Insufficient documentation

## 2022-12-18 MED ORDER — METHYLPHENIDATE HCL ER (CD) 20 MG PO CPCR
20.0000 mg | ORAL_CAPSULE | ORAL | 0 refills | Status: DC
  Filled 2022-12-18 – 2023-02-03 (×2): qty 30, 30d supply, fill #0

## 2022-12-18 MED ORDER — METHYLPHENIDATE HCL ER (CD) 20 MG PO CPCR
20.0000 mg | ORAL_CAPSULE | ORAL | 0 refills | Status: DC
  Filled 2022-12-18 – 2023-03-05 (×3): qty 30, 30d supply, fill #0

## 2022-12-18 MED ORDER — METHYLPHENIDATE HCL ER (CD) 20 MG PO CPCR
20.0000 mg | ORAL_CAPSULE | ORAL | 0 refills | Status: DC
  Filled 2022-12-18 – 2023-01-06 (×2): qty 30, 30d supply, fill #0

## 2022-12-18 NOTE — Progress Notes (Signed)
   Subjective:    Patient ID: Cindy Hamilton, female    DOB: [birth year], 37 y.o.   MRN: [id number]  HPI Here for 4 month follow up.  Patient relates her stress levels are doing better Her work amount has gone down compared to where it was She is trying to do a better job with healthy eating although she still struggles with food choices Her weight has gone up She states she is currently focusing more on thinking and more activity She states she will be coming back to part-time work in August This patient has adult ADD. Takes medication responsibly. Medication does help the patient focus in be more functional. Patient relates that they are or not abusing the medication or misusing the medication. The patient understands that if they're having any negative side effects such as elevated high blood pressure severe headaches they would need stop the medication follow-up immediately. They also understand that the prescriptions are to last for 3 months then the patient will need to follow-up before having further prescriptions.  Patient compliance good compliance  Does medication help patient function /attention better she relates it does help her stay more focused and attentive  Side effects denies side effects PDMP checked Review of Systems     Objective:   Physical Exam  General-in no acute distress Eyes-no discharge Lungs-respiratory rate normal, CTA CV-no murmurs,RRR Extremities skin warm dry no edema Neuro grossly normal Behavior normal, alert       Assessment & Plan:  1. Attention deficit disorder (ADD) without hyperactivity She relates medicine does help her The patient was seen today as part of the visit regarding ADD.  Patient is stable on current regimen.  Appropriate prescriptions prescribed.  Medications were reviewed with the patient as well as compliance. Side effects were checked for. Discussion regarding effectiveness was held. Prescriptions were electronically  sent in.  Patient reminded to follow-up in approximately 3 months.   Plans to [facility] law with drug registry was checked and verified while present with the patient.   2. Morbid obesity (HCC) Good effort for the most part but still struggles with food choices healthy diet regular activity discussed portion control discussed  Follow-up by September latest

## 2022-12-19 ENCOUNTER — Other Ambulatory Visit (HOSPITAL_COMMUNITY): Payer: Self-pay

## 2022-12-20 ENCOUNTER — Other Ambulatory Visit (HOSPITAL_COMMUNITY): Payer: Self-pay

## 2023-01-01 ENCOUNTER — Other Ambulatory Visit (HOSPITAL_COMMUNITY): Payer: Self-pay

## 2023-01-02 ENCOUNTER — Other Ambulatory Visit (HOSPITAL_COMMUNITY): Payer: Self-pay

## 2023-01-02 ENCOUNTER — Other Ambulatory Visit: Payer: Self-pay

## 2023-01-06 ENCOUNTER — Other Ambulatory Visit: Payer: Self-pay

## 2023-02-03 ENCOUNTER — Encounter: Payer: Self-pay | Admitting: Neurology

## 2023-02-03 ENCOUNTER — Other Ambulatory Visit (HOSPITAL_COMMUNITY): Payer: Self-pay

## 2023-02-04 ENCOUNTER — Other Ambulatory Visit: Payer: Self-pay

## 2023-02-04 NOTE — Telephone Encounter (Signed)
Secure message sent to [facility] for autopap setup.

## 2023-02-04 NOTE — Telephone Encounter (Signed)
Order sent over to [facility] 

## 2023-02-05 NOTE — Telephone Encounter (Signed)
[facility] confirmed receipt of order.  

## 2023-03-04 ENCOUNTER — Other Ambulatory Visit (HOSPITAL_COMMUNITY): Payer: Self-pay

## 2023-03-05 ENCOUNTER — Other Ambulatory Visit: Payer: Self-pay

## 2023-03-05 ENCOUNTER — Other Ambulatory Visit (HOSPITAL_COMMUNITY): Payer: Self-pay

## 2023-03-07 ENCOUNTER — Other Ambulatory Visit: Payer: Self-pay

## 2023-03-12 ENCOUNTER — Ambulatory Visit: Payer: 59 | Admitting: Family Medicine

## 2023-03-12 ENCOUNTER — Encounter: Payer: Self-pay | Admitting: Family Medicine

## 2023-03-12 VITALS — BP 111/67 | HR 98 | Temp 98.0°F | Ht 70.0 in | Wt 257.0 lb

## 2023-03-12 DIAGNOSIS — H6992 Unspecified Eustachian tube disorder, left ear: Secondary | ICD-10-CM | POA: Diagnosis not present

## 2023-03-12 DIAGNOSIS — F988 Other specified behavioral and emotional disorders with onset usually occurring in childhood and adolescence: Secondary | ICD-10-CM

## 2023-03-12 MED ORDER — METHYLPHENIDATE HCL ER (CD) 20 MG PO CPCR
20.0000 mg | ORAL_CAPSULE | ORAL | 0 refills | Status: DC
  Filled 2023-03-12 – 2023-05-06 (×2): qty 30, 30d supply, fill #0

## 2023-03-12 MED ORDER — METHYLPHENIDATE HCL ER (CD) 20 MG PO CPCR
20.0000 mg | ORAL_CAPSULE | ORAL | 0 refills | Status: DC
  Filled 2023-03-12 – 2023-06-04 (×2): qty 30, 30d supply, fill #0

## 2023-03-12 MED ORDER — METHYLPHENIDATE HCL ER (CD) 20 MG PO CPCR
20.0000 mg | ORAL_CAPSULE | ORAL | 0 refills | Status: DC
  Filled 2023-03-12 – 2023-04-04 (×2): qty 30, 30d supply, fill #0

## 2023-03-12 NOTE — Progress Notes (Signed)
   Subjective:    Patient ID: Cindy Hamilton, female    DOB: [birth year], 37 y.o.   MRN: [id number]  HPI Ear discomfort and partial hearing loss in left ear since Monday 2 days ago Patient relates that she is doing some exercise lifting some weights and she felt left ear discomfort and then is a muffled hearing and at times she could hear pulsatile sensation in her left ear and other times it would go away he was actually more yesterday and not as much today she denies any sinus pressure pain denies drainage coughing wheezing or difficulty breathing  This patient has adult ADD. Takes medication responsibly. Medication does help the patient focus in be more functional. Patient relates that they are or not abusing the medication or misusing the medication. The patient understands that if they're having any negative side effects such as elevated high blood pressure severe headaches they would need stop the medication follow-up immediately. They also understand that the prescriptions are to last for 3 months then the patient will need to follow-up before having further prescriptions.  Patient compliance good compliance  Does medication help patient function /attention better she states it does help her with her function and attention but she does not want to go up on the dose because it increases anxiety and jitteriness  Side effects no side effects   Review of Systems     Objective:   Physical Exam  General-in no acute distress Eyes-no discharge Lungs-respiratory rate normal, CTA CV-no murmurs,RRR Extremities skin warm dry no edema Neuro grossly normal Behavior normal, alert  Eardrums are normal     Assessment & Plan:  1. Disorder of left eustachian tube This should resolve over the course of the next 7 days if she is not dramatically better by Monday or Tuesday she is to let us know we will help set her up with ENT  I do not feel that she has an aneurysm but if the pulsatile  tinnitus continues or comes back she will need to have imaging  2. Attention deficit disorder (ADD) without hyperactivity The patient was seen today as part of the visit regarding ADD.  Patient is stable on current regimen.  Appropriate prescriptions prescribed.  Medications were reviewed with the patient as well as compliance. Side effects were checked for. Discussion regarding effectiveness was held. Prescriptions were electronically sent in.  Patient reminded to follow-up in approximately 3 months.   Plans to [facility] law with drug registry was checked and verified while present with the patient. She is doing well with the medicine we will go ahead and send in 3 scripts and follow-up in 3 months

## 2023-03-13 ENCOUNTER — Other Ambulatory Visit: Payer: Self-pay

## 2023-03-13 ENCOUNTER — Other Ambulatory Visit (HOSPITAL_COMMUNITY): Payer: Self-pay

## 2023-03-17 ENCOUNTER — Encounter: Payer: Self-pay | Admitting: Family Medicine

## 2023-04-02 ENCOUNTER — Other Ambulatory Visit (HOSPITAL_COMMUNITY): Payer: Self-pay

## 2023-04-04 ENCOUNTER — Other Ambulatory Visit: Payer: Self-pay

## 2023-04-04 ENCOUNTER — Other Ambulatory Visit (HOSPITAL_COMMUNITY): Payer: Self-pay

## 2023-04-13 DIAGNOSIS — G4733 Obstructive sleep apnea (adult) (pediatric): Secondary | ICD-10-CM | POA: Diagnosis not present

## 2023-04-14 ENCOUNTER — Telehealth: Payer: Self-pay | Admitting: Neurology

## 2023-04-14 NOTE — Telephone Encounter (Signed)
Pt schedule initial CPAP 04/12/23-06/12/23.

## 2023-04-16 ENCOUNTER — Ambulatory Visit: Payer: 59 | Admitting: Family Medicine

## 2023-05-06 ENCOUNTER — Other Ambulatory Visit (HOSPITAL_COMMUNITY): Payer: Self-pay

## 2023-05-08 ENCOUNTER — Telehealth: Payer: Self-pay

## 2023-05-08 ENCOUNTER — Ambulatory Visit: Payer: 59 | Admitting: Neurology

## 2023-05-08 ENCOUNTER — Encounter: Payer: Self-pay | Admitting: Neurology

## 2023-05-08 ENCOUNTER — Other Ambulatory Visit (HOSPITAL_COMMUNITY): Payer: Self-pay

## 2023-05-08 VITALS — BP 115/75 | HR 90 | Ht 70.0 in | Wt 262.0 lb

## 2023-05-08 DIAGNOSIS — G4733 Obstructive sleep apnea (adult) (pediatric): Secondary | ICD-10-CM | POA: Diagnosis not present

## 2023-05-08 NOTE — Telephone Encounter (Signed)
Community message sent via epic for CPAP orders

## 2023-05-08 NOTE — Progress Notes (Addendum)
Patient: Cindy Hamilton Date of Birth: [birth year]  Reason for Visit: Follow up History from: Patient Primary Neurologist: Cindy Hamilton   ASSESSMENT AND PLAN 37 y.o. year old female   1.  OSA on CPAP  -Overall, has superb compliance.  Has very good subjective benefit.  Continue nightly CPAP usage.  Recommend minimum of 4 hours nightly utilization.  Continue current settings.  Will send an order to DME to send supplies as needed.  Follow-up virtually 1 year or sooner if needed  HISTORY OF PRESENT ILLNESS: Today 05/08/23 Had PSG 06/13/2022.  Showed mild/borderline OSA with total AHI of 6/hour, REM AHI of 19.7/hour and O2 nadir of 89%. CPAP setup date 03/13/23. CPAP is helping, more energy during the day.  Download shows excellent compliance, 90% greater than 4 hours. 5-11 centimeters water.  Leak 12.4.  AHI 1.0.  Uses nasal pillow mask, has fullface mask as backup.  There were 3 nights she did not use due to going camping.  On those nights, she felt very tired during the day.  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  She is pleased with CPAP, motivated to continue.  ESS 6.  HISTORY  03/20/22 Dr. Frances Hamilton: I saw your patient, Cindy Hamilton, upon your kind request in my sleep clinic today for initial consultation of her sleep disorder, in particular, concern for underlying obstructive sleep apnea.  The patient is accompanied by her husband today.  As you know, Ms. Cindy Hamilton is a 37 year old female with an underlying medical history of reflux disease, ADD, asthma, anemia, anxiety, depression, and obesity, who reports snoring and excessive daytime somnolence.  I reviewed your office note from 12/19/2021.  Her weight has been fluctuating.  She has had stress in the recent past but with her husband's medical condition.  She works full-time as a Systems analyst.  She denies night to night nocturia or recurrent morning or nocturnal headaches.  Her father has sleep apnea.  She drinks caffeine  in the form of coffee, 1 or 2 cups/day, she is a non-smoker and does not currently consume any alcohol.  She lives with her spouse, they have 1 dog in the household, they do not have a TV in the bedroom.  She goes to bed generally between 10 and 11 and rise time is between 6 and 6:30 AM.  She has a history of sleep talking for her husband.  She did have sleep talking as a child as well.  She had recent testing for attention deficit and was found to have residual adult attention deficit disorder.  She is on long-acting methylphenidate.  Her Epworth sleepiness score despite this is 14 out of 24, fatigue severity score is 25 out of 63.   REVIEW OF SYSTEMS: Out of a complete 14 system review of symptoms, the patient complains only of the following symptoms, and all other reviewed systems are negative.  See HPI  ALLERGIES: Allergies  Allergen Reactions   Zinc Dermatitis    HOME MEDICATIONS: Outpatient Medications Prior to Visit  Medication Sig Dispense Refill   albuterol (VENTOLIN HFA) 108 (90 Base) MCG/ACT inhaler Inhale 2 puffs into the lungs every 6 (six) hours as needed for wheezing or shortness of breath. 18 g 0   cholecalciferol (VITAMIN D3) 25 MCG (1000 UNIT) tablet Take 1,000 Units by mouth daily.     famotidine (PEPCID) 20 MG tablet Take 1 tablet (20 mg total) by mouth 2 (two) times daily. (Patient taking differently: Take 20 mg by  mouth as directed. As needed at bedtime.) 90 tablet 1   ferrous sulfate 325 (65 FE) MG EC tablet Take 1 tablet (325 mg total) by mouth daily. 90 tablet 2   FLUoxetine (PROZAC) 20 MG capsule Take 1 capsule (20 mg total) by mouth daily. 90 capsule 3   fluticasone (FLOVENT HFA) 110 MCG/ACT inhaler Use 2 puffs twice a day as directed to help control reactive airway 12 g 12   methylphenidate (METADATE CD) 20 MG CR capsule Take 1 capsule (20 mg total) by mouth every morning. 30 capsule 0   [START ON 06/03/2023] methylphenidate (METADATE CD) 20 MG CR capsule Take 1  capsule (20 mg total) by mouth every morning. 30 capsule 0   valACYclovir (VALTREX) 1000 MG tablet Take 2 tablets by mouth at first sign of a cold sore. Repeat in 12 hours. Use as needed as directed 60 tablet 2   methylphenidate (METADATE CD) 20 MG CR capsule Take 1 capsule (20 mg total) by mouth every morning. (Patient not taking: Reported on 05/08/2023) 30 capsule 0   No facility-administered medications prior to visit.    PAST MEDICAL HISTORY: Past Medical History:  Diagnosis Date   ADD (attention deficit disorder) 12/19/2021   Diagnosed by psychology   Anemia    Anxiety    Asthma    Depression    GERD (gastroesophageal reflux disease) 12/24/2021   On famotidine 20 mg   Heart murmur    Herpes labialis without complication 12/24/2021   Uses intermittent valacyclovir   Hx of cold sores 12/24/2021   Uses intermittent valacyclovir   Iron deficiency anemia, unspecified 12/24/2021   Due to heavy cycles.  Takes ferrous sulfate 325 mg daily   Reactive airway disease 12/19/2021   Worse during seasonal allergy peaks    PAST SURGICAL HISTORY: Past Surgical History:  Procedure Laterality Date   CHOLECYSTECTOMY N/A 05/15/2022   Procedure: LAPAROSCOPIC CHOLECYSTECTOMY;  Surgeon: Lucretia Roers, MD;  Location: [facility];  Service: General;  Laterality: N/A;  pt knows to arrive at 7:00   COLONOSCOPY     remote past in [place].    FAMILY HISTORY: Family History  Problem Relation Age of Onset   Osteopenia Father    Prostate cancer Father    Sleep apnea Father    Colon polyps Brother        unknown if adenomas   Cancer Paternal Aunt        Ovarian   Cancer Maternal Grandmother        Non-Hodgkin's lymphoma   Diabetes Maternal Grandfather    Heart disease Maternal Grandfather    Prostate cancer Paternal Grandfather    Colon cancer Neg Hx     SOCIAL HISTORY: Social History   Socioeconomic History   Marital status: Married    Spouse name: Not on file   Number of children: Not on  file   Years of education: Not on file   Highest education level: Not on file  Occupational History   Not on file  Tobacco Use   Smoking status: Never   Smokeless tobacco: Never  Vaping Use   Vaping status: Never Used  Substance and Sexual Activity   Alcohol use: Never   Drug use: Never   Sexual activity: Yes  Other Topics Concern   Not on file  Social History Narrative   Not on file   Social Determinants of Health   Financial Resource Strain: Not on file  Food Insecurity: Not on file  Transportation  Needs: Not on file  Physical Activity: Not on file  Stress: Not on file  Social Connections: Not on file  Intimate Partner Violence: Not on file   PHYSICAL EXAM  Vitals:   05/08/23 1249  BP: 115/75  Pulse: 90  Weight: 262 lb (118.8 kg)  Height: 5\' 10"  (1.778 m)   Body mass index is 37.59 kg/m.  Generalized: Well developed, in no acute distress  Neurological examination  Mentation: Alert oriented to time, place, history taking. Follows all commands speech and language fluent Cranial nerve II-XII: Extraocular movements were full, visual field were full on confrontational test.  Motor: Moves all extremities independently Gait and station: Gait is normal.   DIAGNOSTIC DATA (LABS, IMAGING, TESTING) - I reviewed patient records, labs, notes, testing and imaging myself where available.  Lab Results  Component Value Date   WBC 6.0 09/06/2022   HGB 11.8 09/06/2022   HCT 36.1 09/06/2022   MCV 81 09/06/2022   PLT 306 09/06/2022      Component Value Date/Time   NA 138 09/06/2022 0835   K 4.4 09/06/2022 0835   CL 102 09/06/2022 0835   CO2 24 09/06/2022 0835   GLUCOSE 93 09/06/2022 0835   GLUCOSE 94 05/18/2022 0642   BUN 7 09/06/2022 0835   CREATININE 0.63 09/06/2022 0835   CREATININE 0.66 03/11/2022 0730   CALCIUM 9.3 09/06/2022 0835   PROT 6.6 09/06/2022 0835   ALBUMIN 4.3 09/06/2022 0835   AST 19 09/06/2022 0835   ALT 18 09/06/2022 0835   ALKPHOS 97  09/06/2022 0835   BILITOT 0.2 09/06/2022 0835   GFRNONAA >60 05/18/2022 0642   Lab Results  Component Value Date   CHOL 188 10/19/2021   HDL 43 10/19/2021   LDLCALC 125 (H) 10/19/2021   TRIG 112 10/19/2021   CHOLHDL 4.4 10/19/2021   Lab Results  Component Value Date   HGBA1C 5.7 (H) 09/06/2022   Lab Results  Component Value Date   VITAMINB12 546 11/03/2020   Lab Results  Component Value Date   TSH 1.790 10/19/2021    Margie Ege, AGNP-C, DNP 05/08/2023, 12:58 PM [facility] [address], [address] [place], [place] [address] [phone]

## 2023-05-08 NOTE — Patient Instructions (Signed)
Great to meet you today!!  Continue superb CPAP usage!1 Continue nightly CPAP usage.  Recommend minimum of 4 hours nightly utilization.  Continue current settings.  Will send an order to DME to send supplies as needed.  Follow-up with me virtually in 1 year.  Thanks!!

## 2023-05-09 DIAGNOSIS — G4733 Obstructive sleep apnea (adult) (pediatric): Secondary | ICD-10-CM | POA: Diagnosis not present

## 2023-06-04 ENCOUNTER — Other Ambulatory Visit (HOSPITAL_COMMUNITY): Payer: Self-pay

## 2023-06-05 ENCOUNTER — Other Ambulatory Visit (HOSPITAL_COMMUNITY): Payer: Self-pay

## 2023-06-09 DIAGNOSIS — G4733 Obstructive sleep apnea (adult) (pediatric): Secondary | ICD-10-CM | POA: Diagnosis not present

## 2023-06-11 DIAGNOSIS — G4733 Obstructive sleep apnea (adult) (pediatric): Secondary | ICD-10-CM | POA: Diagnosis not present

## 2023-06-18 ENCOUNTER — Ambulatory Visit: Payer: 59 | Admitting: Family Medicine

## 2023-06-18 VITALS — BP 111/77 | HR 74 | Temp 97.5°F | Ht 70.0 in | Wt 261.6 lb

## 2023-06-18 DIAGNOSIS — F988 Other specified behavioral and emotional disorders with onset usually occurring in childhood and adolescence: Secondary | ICD-10-CM | POA: Diagnosis not present

## 2023-06-18 DIAGNOSIS — D509 Iron deficiency anemia, unspecified: Secondary | ICD-10-CM | POA: Diagnosis not present

## 2023-06-18 DIAGNOSIS — E785 Hyperlipidemia, unspecified: Secondary | ICD-10-CM

## 2023-06-18 DIAGNOSIS — F902 Attention-deficit hyperactivity disorder, combined type: Secondary | ICD-10-CM

## 2023-06-18 DIAGNOSIS — R7309 Other abnormal glucose: Secondary | ICD-10-CM | POA: Diagnosis not present

## 2023-06-18 DIAGNOSIS — M79671 Pain in right foot: Secondary | ICD-10-CM | POA: Diagnosis not present

## 2023-06-19 ENCOUNTER — Other Ambulatory Visit (HOSPITAL_COMMUNITY): Payer: Self-pay

## 2023-06-19 ENCOUNTER — Other Ambulatory Visit: Payer: Self-pay

## 2023-06-19 MED ORDER — METHYLPHENIDATE HCL ER (CD) 20 MG PO CPCR
20.0000 mg | ORAL_CAPSULE | ORAL | 0 refills | Status: DC
  Filled 2023-06-19 – 2023-07-07 (×2): qty 30, 30d supply, fill #0

## 2023-06-19 MED ORDER — METHYLPHENIDATE HCL ER (CD) 20 MG PO CPCR
20.0000 mg | ORAL_CAPSULE | ORAL | 0 refills | Status: DC
  Filled 2023-06-19 – 2023-08-07 (×2): qty 30, 30d supply, fill #0

## 2023-06-19 MED ORDER — METHYLPHENIDATE HCL ER (CD) 20 MG PO CPCR
20.0000 mg | ORAL_CAPSULE | Freq: Every morning | ORAL | 0 refills | Status: DC
  Filled 2023-06-19 – 2023-09-08 (×2): qty 30, 30d supply, fill #0

## 2023-06-19 NOTE — Progress Notes (Signed)
   Subjective:    Patient ID: Cindy Hamilton, female    DOB: 21-Sep-1985, 37 y.o.   MRN: [id number]  HPI This patient has adult ADD. Takes medication responsibly. Medication does help the patient focus in be more functional. Patient relates that they are or not abusing the medication or misusing the medication. The patient understands that if they're having any negative side effects such as elevated high blood pressure severe headaches they would need stop the medication follow-up immediately. They also understand that the prescriptions are to last for 3 months then the patient will need to follow-up before having further prescriptions.  Patient compliance good compliance takes it on a regular basis  Does medication help patient function /attention better states it does help her focus the current dose of the medicine is well-tolerated  Side effects denies side effects  Patient states she has had a difficult time being mindful of her eating patterns and exercise she states she does have a desire to do better does not quite have a plan yet we did discuss making small steps in diet and also exercise to help out   Review of Systems     Objective:   Physical Exam Lungs clear respiratory rate normal heart is regular I do not see any evidence of excessive shoe wear on the lateral portion of her right shoe Good range of motion watched her walk and did not see any abnormal aspect today        Assessment & Plan:  1. Attention deficit disorder (ADD) without hyperactivity 3 scripts were sent in The patient was seen today as part of the visit regarding ADD.  Patient is stable on current regimen.  Appropriate prescriptions prescribed.  Medications were reviewed with the patient as well as compliance. Side effects were checked for. Discussion regarding effectiveness was held. Prescriptions were electronically sent in.  Patient reminded to follow-up in approximately 3 months.   Plans to [facility] law with drug registry was checked and verified while present with the patient. She is doing well with the medicine She will follow-up in approximately 4 months - methylphenidate (METADATE CD) 20 MG CR capsule; Take 1 capsule (20 mg total) by mouth every morning.  Dispense: 30 capsule; Refill: 0  2. Iron deficiency anemia, unspecified iron deficiency anemia type Has a history of iron deficient anemia and low ferritin we will check lab work await results  3. Right foot pain She had previous plantar fasciitis then started noticing she was walking on the outside of the right foot causing some discomfort as well as discomfort into the knee and hip she wonders if she may need orthotics we will help set her up with podiatry  4. Elevated hemoglobin A1c Previous elevated A1c very important for her to work toward a healthy eating pattern as well as portion control and fitting in regular physical activity She is doing a better job of striking a Designer, jewellery and hopefully this will translate into a better plan for the coming months GLP-1 medicines would be potentially beneficial but currently are not covered by insurance  5. Hyperlipidemia, unspecified hyperlipidemia type Healthy diet regular physical activity no statin indicated currently  6. Morbid obesity (HCC) Portion control regular physical activity  Follow-up in 4 months

## 2023-06-20 ENCOUNTER — Other Ambulatory Visit: Payer: Self-pay

## 2023-06-20 DIAGNOSIS — E785 Hyperlipidemia, unspecified: Secondary | ICD-10-CM

## 2023-06-20 DIAGNOSIS — M79671 Pain in right foot: Secondary | ICD-10-CM

## 2023-06-20 DIAGNOSIS — D509 Iron deficiency anemia, unspecified: Secondary | ICD-10-CM

## 2023-06-20 DIAGNOSIS — R7309 Other abnormal glucose: Secondary | ICD-10-CM

## 2023-06-27 ENCOUNTER — Other Ambulatory Visit: Payer: Self-pay

## 2023-06-27 ENCOUNTER — Other Ambulatory Visit (HOSPITAL_COMMUNITY): Payer: Self-pay

## 2023-07-07 ENCOUNTER — Other Ambulatory Visit (HOSPITAL_COMMUNITY): Payer: Self-pay

## 2023-07-08 ENCOUNTER — Other Ambulatory Visit: Payer: Self-pay

## 2023-07-09 ENCOUNTER — Other Ambulatory Visit: Payer: Self-pay

## 2023-07-11 DIAGNOSIS — G4733 Obstructive sleep apnea (adult) (pediatric): Secondary | ICD-10-CM | POA: Diagnosis not present

## 2023-07-13 DIAGNOSIS — G4733 Obstructive sleep apnea (adult) (pediatric): Secondary | ICD-10-CM | POA: Diagnosis not present

## 2023-08-07 ENCOUNTER — Other Ambulatory Visit (HOSPITAL_COMMUNITY): Payer: Self-pay

## 2023-08-07 ENCOUNTER — Ambulatory Visit (INDEPENDENT_AMBULATORY_CARE_PROVIDER_SITE_OTHER): Payer: 59

## 2023-08-07 ENCOUNTER — Encounter: Payer: Self-pay | Admitting: Podiatry

## 2023-08-07 ENCOUNTER — Ambulatory Visit (INDEPENDENT_AMBULATORY_CARE_PROVIDER_SITE_OTHER): Payer: 59 | Admitting: Podiatry

## 2023-08-07 DIAGNOSIS — M722 Plantar fascial fibromatosis: Secondary | ICD-10-CM | POA: Diagnosis not present

## 2023-08-07 NOTE — Progress Notes (Signed)
 Subjective:  Patient ID: Cindy Hamilton, female    DOB: 13-Apr-1986,  MRN: [id number]  Chief Complaint  Patient presents with   Foot Pain    I have some pain on the medial side of my right foot.  I think I may have some gait issues.  I tend to walk on the lateral side of my foot.  I feel that my weight is not evenly distributed.    38 y.o. female presents with the above complaint.  Patient presents for right heel pain.  Patient states painful to touch.  She wanted get it evaluated.  She does a lot of walking on her feet.  She is a PA for hematology.  Hurts with ambulation hurts with taking for step in the morning.  She has tried some night splinting and over-the-counter stuff which has not helped   Review of Systems: Negative except as noted in the HPI. Denies N/V/F/Ch.  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  Takes ferrous sulfate  325 mg daily   Reactive airway disease 12/19/2021   Worse during seasonal allergy peaks    Current Outpatient Medications:    albuterol  (VENTOLIN  HFA) 108 (90 Base) MCG/ACT inhaler, Inhale 2 puffs into the lungs every 6 (six) hours as needed for wheezing or shortness of breath., Disp: 18 g, Rfl: 0   cholecalciferol (VITAMIN D3) 25 MCG (1000 UNIT) tablet, Take 1,000 Units by mouth daily., Disp: , Rfl:    famotidine  (PEPCID ) 20 MG tablet, Take 1 tablet (20 mg total) by mouth 2 (two) times daily. (Patient taking differently: Take 20 mg by mouth as directed. As needed at bedtime.), Disp: 90 tablet, Rfl: 1   ferrous sulfate  325 (65 FE) MG  EC tablet, Take 1 tablet (325 mg total) by mouth daily., Disp: 90 tablet, Rfl: 2   FLUoxetine  (PROZAC ) 20 MG capsule, Take 1 capsule (20 mg total) by mouth daily., Disp: 90 capsule, Rfl: 3   fluticasone  (FLOVENT  HFA) 110 MCG/ACT inhaler, Use 2 puffs twice a day as directed to help control reactive airway, Disp: 12 g, Rfl: 12   methylphenidate  (METADATE  CD) 20 MG CR capsule, Take 1 capsule (20 mg total) by mouth every morning., Disp: 30 capsule, Rfl: 0   [START ON 09/01/2023] methylphenidate  (METADATE  CD) 20 MG CR capsule, Take 1 capsule (20 mg total) by mouth every morning., Disp: 30 capsule, Rfl: 0   methylphenidate  (METADATE  CD) 20 MG CR capsule, Take 1 capsule (20 mg total) by mouth every morning., Disp: 30 capsule, Rfl: 0   valACYclovir  (VALTREX ) 1000 MG tablet, Take 2 tablets by mouth at first sign of a cold sore. Repeat in 12 hours. Use as needed as directed, Disp: 60 tablet, Rfl: 2  Social History   Tobacco Use  Smoking Status Never  Smokeless Tobacco Never    Allergies  Allergen Reactions   Zinc Dermatitis   Objective:  There were no vitals filed for this visit. There is no height or weight on file to calculate BMI. Constitutional Well developed. Well  nourished.  Vascular Dorsalis pedis pulses palpable bilaterally. Posterior tibial pulses palpable bilaterally. Capillary refill normal to all digits.  No cyanosis or clubbing noted. Pedal hair growth normal.  Neurologic Normal speech. Oriented to person, place, and time. Epicritic sensation to light touch grossly present bilaterally.  Dermatologic Nails well groomed and normal in appearance. No open wounds. No skin lesions.  Orthopedic: Normal joint ROM without pain or crepitus bilaterally. No visible deformities. Tender to palpation at the calcaneal tuber.  Hurts with taking for step in the morning hurts with ambulation. No pain with calcaneal squeeze right. Ankle ROM diminished range of motion right. Silfverskiold Test:  positive right.   Radiographs: Taken and reviewed. No acute fractures or dislocations. No evidence of stress fracture.  Plantar heel spur present. Posterior heel spur absent.  Pes cavus foot structure  Assessment:   1. Plantar fasciitis of right foot    Plan:  Patient was evaluated and treated and all questions answered.  Plantar Fasciitis, right - XR reviewed as above.  - Educated on icing and stretching. Instructions given.  - Injection delivered to the plantar fascia as below. - DME: Plantar fascial brace dispensed to support the medial longitudinal arch of the foot and offload pressure from the heel and prevent arch collapse during weightbearing - Pharmacologic management: None  Procedure: Injection Tendon/Ligament Location: Right plantar fascia at the glabrous junction; medial approach. Skin Prep: alcohol Injectate: 0.5 cc 0.5% marcaine  plain, 0.5 cc of 1% Lidocaine , 0.5 cc kenalog 10. Disposition: Patient tolerated procedure well. Injection site dressed with a band-aid.  No follow-ups on file.

## 2023-08-08 ENCOUNTER — Other Ambulatory Visit: Payer: Self-pay

## 2023-08-08 ENCOUNTER — Other Ambulatory Visit (HOSPITAL_COMMUNITY): Payer: Self-pay

## 2023-08-11 DIAGNOSIS — G4733 Obstructive sleep apnea (adult) (pediatric): Secondary | ICD-10-CM | POA: Diagnosis not present

## 2023-08-13 DIAGNOSIS — G4733 Obstructive sleep apnea (adult) (pediatric): Secondary | ICD-10-CM | POA: Diagnosis not present

## 2023-09-04 ENCOUNTER — Encounter: Payer: Self-pay | Admitting: Podiatry

## 2023-09-04 ENCOUNTER — Ambulatory Visit: Payer: 59 | Admitting: Podiatry

## 2023-09-04 DIAGNOSIS — Q666 Other congenital valgus deformities of feet: Secondary | ICD-10-CM

## 2023-09-04 DIAGNOSIS — M722 Plantar fascial fibromatosis: Secondary | ICD-10-CM | POA: Diagnosis not present

## 2023-09-04 NOTE — Progress Notes (Signed)
 Subjective:  Patient ID: Cindy Hamilton, female    DOB: 30-Jun-1986,  MRN: [id number]  Chief Complaint  Patient presents with   Plantar Fasciitis    It's doing better, that steroid shot was magical.    38 y.o. female presents with the above complaint.  Presents for follow-up of right plantar fasciitis she is doing a lot better.  She says injection helped considerably.  She still has some residual pain denies any other acute complaints   Review of Systems: Negative except as noted in the HPI. Denies N/V/F/Ch.  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  Takes ferrous sulfate  325 mg daily   Reactive airway disease 12/19/2021   Worse during seasonal allergy peaks    Current Outpatient Medications:    albuterol  (VENTOLIN  HFA) 108 (90 Base) MCG/ACT inhaler, Inhale 2 puffs into the lungs every 6 (six) hours as needed for wheezing or shortness of breath., Disp: 18 g, Rfl: 0   cholecalciferol (VITAMIN D3) 25 MCG (1000 UNIT) tablet, Take 1,000 Units by mouth daily., Disp: , Rfl:    famotidine  (PEPCID ) 20 MG tablet, Take 1 tablet (20 mg total) by mouth 2 (two) times daily. (Patient taking differently: Take 20 mg by mouth as directed. As needed at bedtime.), Disp: 90 tablet, Rfl: 1   ferrous sulfate  325 (65 FE) MG EC tablet, Take 1 tablet (325 mg total) by mouth daily., Disp: 90 tablet, Rfl: 2   FLUoxetine  (PROZAC ) 20 MG capsule, Take 1 capsule (20 mg total) by mouth daily., Disp: 90 capsule, Rfl: 3   fluticasone  (FLOVENT  HFA) 110 MCG/ACT inhaler, Use 2 puffs twice a day as  directed to help control reactive airway, Disp: 12 g, Rfl: 12   methylphenidate  (METADATE  CD) 20 MG CR capsule, Take 1 capsule (20 mg total) by mouth every morning., Disp: 30 capsule, Rfl: 0   methylphenidate  (METADATE  CD) 20 MG CR capsule, Take 1 capsule (20 mg total) by mouth every morning., Disp: 30 capsule, Rfl: 0   methylphenidate  (METADATE  CD) 20 MG CR capsule, Take 1 capsule (20 mg total) by mouth every morning., Disp: 30 capsule, Rfl: 0   valACYclovir  (VALTREX ) 1000 MG tablet, Take 2 tablets by mouth at first sign of a cold sore. Repeat in 12 hours. Use as needed as directed, Disp: 60 tablet, Rfl: 2  Social History   Tobacco Use  Smoking Status Never  Smokeless Tobacco Never    Allergies  Allergen Reactions   Zinc Dermatitis   Objective:  There were no vitals filed for this visit. There is no height or weight on file to calculate BMI. Constitutional Well developed. Well nourished.  Vascular Dorsalis pedis pulses palpable bilaterally. Posterior tibial pulses palpable bilaterally. Capillary refill normal to all digits.  No cyanosis or clubbing noted. Pedal hair growth normal.  Neurologic Normal speech. Oriented to person, place, and time. Epicritic sensation to light touch grossly present bilaterally.  Dermatologic Nails well groomed and normal in appearance. No open wounds. No skin lesions.  Orthopedic: Normal joint ROM without  pain or crepitus bilaterally. No visible deformities. Tender to palpation at the calcaneal tuber.  Hurts with taking for step in the morning hurts with ambulation. No pain with calcaneal squeeze right. Ankle ROM diminished range of motion right. Silfverskiold Test: positive right.   Radiographs: Taken and reviewed. No acute fractures or dislocations. No evidence of stress fracture.  Plantar heel spur present. Posterior heel spur absent.  Pes cavus foot structure  Assessment:   No diagnosis found.  Plan:  Patient was evaluated and treated  and all questions answered.  Plantar Fasciitis, right - XR reviewed as above.  - Educated on icing and stretching. Instructions given.  -Second injection delivered to the plantar fascia as below. - DME: Plantar fascial brace dispensed to support the medial longitudinal arch of the foot and offload pressure from the heel and prevent arch collapse during weightbearing - Pharmacologic management: None  Pes planovalgus -I explained to patient the etiology of pes planovalgus and relationship with Planter fasciitis and various treatment options were discussed.  Given patient foot structure in the setting of Planter fasciitis I believe patient will benefit from custom-made orthotics to help control the hindfoot motion support the arch of the foot and take the stress away from plantar fascial.  Patient agrees with the plan like to proceed with orthotics -Patient was casted for orthotics   Procedure: Injection Tendon/Ligament Location: Right plantar fascia at the glabrous junction; medial approach. Skin Prep: alcohol Injectate: 0.5 cc 0.5% marcaine  plain, 0.5 cc of 1% Lidocaine , 0.5 cc kenalog 10. Disposition: Patient tolerated procedure well. Injection site dressed with a band-aid.  No follow-ups on file.

## 2023-09-08 ENCOUNTER — Other Ambulatory Visit (HOSPITAL_COMMUNITY): Payer: Self-pay

## 2023-09-09 ENCOUNTER — Other Ambulatory Visit: Payer: Self-pay

## 2023-09-10 ENCOUNTER — Other Ambulatory Visit (HOSPITAL_COMMUNITY): Payer: Self-pay

## 2023-09-12 ENCOUNTER — Other Ambulatory Visit (HOSPITAL_COMMUNITY): Payer: Self-pay

## 2023-09-12 ENCOUNTER — Telehealth: Payer: Self-pay

## 2023-09-12 NOTE — Telephone Encounter (Signed)
Taking orthos by dr Allena Katz to [facility] Monday 2/17

## 2023-09-13 DIAGNOSIS — G4733 Obstructive sleep apnea (adult) (pediatric): Secondary | ICD-10-CM | POA: Diagnosis not present

## 2023-09-14 ENCOUNTER — Encounter: Payer: Self-pay | Admitting: Family Medicine

## 2023-09-15 ENCOUNTER — Other Ambulatory Visit: Payer: Self-pay

## 2023-09-23 ENCOUNTER — Other Ambulatory Visit: Payer: Self-pay

## 2023-09-25 ENCOUNTER — Encounter: Payer: Self-pay | Admitting: Podiatry

## 2023-09-25 ENCOUNTER — Ambulatory Visit (INDEPENDENT_AMBULATORY_CARE_PROVIDER_SITE_OTHER): Payer: 59 | Admitting: Podiatry

## 2023-09-25 DIAGNOSIS — M722 Plantar fascial fibromatosis: Secondary | ICD-10-CM

## 2023-09-25 DIAGNOSIS — Q666 Other congenital valgus deformities of feet: Secondary | ICD-10-CM

## 2023-09-25 NOTE — Progress Notes (Signed)
 Doing well with the orthotics were dispensed to well.  If any acute issues she will come back and see me

## 2023-10-10 DIAGNOSIS — E785 Hyperlipidemia, unspecified: Secondary | ICD-10-CM | POA: Diagnosis not present

## 2023-10-10 DIAGNOSIS — D509 Iron deficiency anemia, unspecified: Secondary | ICD-10-CM | POA: Diagnosis not present

## 2023-10-10 DIAGNOSIS — R7309 Other abnormal glucose: Secondary | ICD-10-CM | POA: Diagnosis not present

## 2023-10-11 DIAGNOSIS — G4733 Obstructive sleep apnea (adult) (pediatric): Secondary | ICD-10-CM | POA: Diagnosis not present

## 2023-10-11 LAB — IRON,TIBC AND FERRITIN PANEL
Ferritin: 32 ng/mL (ref 15–150)
Iron Saturation: 15 % (ref 15–55)
Iron: 47 ug/dL (ref 27–159)
Total Iron Binding Capacity: 320 ug/dL (ref 250–450)
UIBC: 273 ug/dL (ref 131–425)

## 2023-10-11 LAB — CBC WITH DIFFERENTIAL/PLATELET
Basophils Absolute: 0.1 10*3/uL (ref 0.0–0.2)
Basos: 1 %
EOS (ABSOLUTE): 0.1 10*3/uL (ref 0.0–0.4)
Eos: 2 %
Hematocrit: 33.8 % — ABNORMAL LOW (ref 34.0–46.6)
Hemoglobin: 10.9 g/dL — ABNORMAL LOW (ref 11.1–15.9)
Immature Grans (Abs): 0 10*3/uL (ref 0.0–0.1)
Immature Granulocytes: 0 %
Lymphocytes Absolute: 1.8 10*3/uL (ref 0.7–3.1)
Lymphs: 36 %
MCH: 26.6 pg (ref 26.6–33.0)
MCHC: 32.2 g/dL (ref 31.5–35.7)
MCV: 82 fL (ref 79–97)
Monocytes Absolute: 0.4 10*3/uL (ref 0.1–0.9)
Monocytes: 7 %
Neutrophils Absolute: 2.7 10*3/uL (ref 1.4–7.0)
Neutrophils: 54 %
Platelets: 296 10*3/uL (ref 150–450)
RBC: 4.1 x10E6/uL (ref 3.77–5.28)
RDW: 14 % (ref 11.7–15.4)
WBC: 5 10*3/uL (ref 3.4–10.8)

## 2023-10-11 LAB — HEMOGLOBIN A1C
Est. average glucose Bld gHb Est-mCnc: 114 mg/dL
Hgb A1c MFr Bld: 5.6 % (ref 4.8–5.6)

## 2023-10-11 LAB — LIPID PANEL
Chol/HDL Ratio: 4.5 ratio — ABNORMAL HIGH (ref 0.0–4.4)
Cholesterol, Total: 165 mg/dL (ref 100–199)
HDL: 37 mg/dL — ABNORMAL LOW (ref >39)
LDL Chol Calc (NIH): 106 mg/dL — ABNORMAL HIGH (ref 0–99)
Triglycerides: 124 mg/dL (ref 0–149)
VLDL Cholesterol Cal: 22 mg/dL (ref 5–40)

## 2023-10-12 ENCOUNTER — Encounter: Payer: Self-pay | Admitting: Family Medicine

## 2023-10-21 ENCOUNTER — Other Ambulatory Visit (HOSPITAL_COMMUNITY): Payer: Self-pay

## 2023-10-21 ENCOUNTER — Other Ambulatory Visit: Payer: Self-pay | Admitting: Family Medicine

## 2023-10-21 DIAGNOSIS — F988 Other specified behavioral and emotional disorders with onset usually occurring in childhood and adolescence: Secondary | ICD-10-CM

## 2023-10-22 ENCOUNTER — Telehealth: Payer: Self-pay | Admitting: Family Medicine

## 2023-10-22 ENCOUNTER — Other Ambulatory Visit (HOSPITAL_COMMUNITY): Payer: Self-pay

## 2023-10-22 ENCOUNTER — Ambulatory Visit: Payer: 59 | Admitting: Family Medicine

## 2023-10-22 ENCOUNTER — Encounter: Payer: Self-pay | Admitting: Family Medicine

## 2023-10-22 VITALS — BP 117/76 | HR 85 | Temp 98.1°F | Ht 70.0 in | Wt 267.0 lb

## 2023-10-22 DIAGNOSIS — E785 Hyperlipidemia, unspecified: Secondary | ICD-10-CM | POA: Diagnosis not present

## 2023-10-22 DIAGNOSIS — D509 Iron deficiency anemia, unspecified: Secondary | ICD-10-CM | POA: Diagnosis not present

## 2023-10-22 DIAGNOSIS — F988 Other specified behavioral and emotional disorders with onset usually occurring in childhood and adolescence: Secondary | ICD-10-CM | POA: Diagnosis not present

## 2023-10-22 MED ORDER — FLUOXETINE HCL 10 MG PO CAPS
10.0000 mg | ORAL_CAPSULE | Freq: Every day | ORAL | 0 refills | Status: DC
  Filled 2023-10-22: qty 30, 30d supply, fill #0

## 2023-10-22 MED ORDER — METHYLPHENIDATE HCL ER (CD) 20 MG PO CPCR
20.0000 mg | ORAL_CAPSULE | Freq: Every morning | ORAL | 0 refills | Status: DC
  Filled 2023-10-22: qty 30, 30d supply, fill #0

## 2023-10-22 MED ORDER — METHYLPHENIDATE HCL ER (CD) 20 MG PO CPCR
20.0000 mg | ORAL_CAPSULE | ORAL | 0 refills | Status: DC
  Filled 2023-10-22 – 2023-11-21 (×2): qty 30, 30d supply, fill #0

## 2023-10-22 NOTE — Progress Notes (Signed)
   Subjective:    Patient ID: Cindy Hamilton, female    DOB: 1986/07/05, 38 y.o.   MRN: [id number]  HPI This patient has adult ADD. Takes medication responsibly. Medication does help the patient focus in be more functional. Patient relates that they are or not abusing the medication or misusing the medication. The patient understands that if they're having any negative side effects such as elevated high blood pressure severe headaches they would need stop the medication follow-up immediately. They also understand that the prescriptions are to last for 3 months then the patient will need to follow-up before having further prescriptions.  Patient compliance good compliance  Does medication help patient function /attention better does help with focus  Side effects denies side effects    Review of Systems     Objective:   Physical Exam General-in no acute distress Eyes-no discharge Lungs-respiratory rate normal, CTA CV-no murmurs,RRR Extremities skin warm dry no edema Neuro grossly normal Behavior normal, alert        Assessment & Plan:   1. Attention deficit disorder (ADD) without hyperactivity Continue medication prescription sent in - methylphenidate (METADATE CD) 20 MG CR capsule; Take 1 capsule (20 mg total) by mouth every morning.  Dispense: 30 capsule; Refill: 0  2. Iron deficiency anemia, unspecified iron deficiency anemia type (Primary) Referral to hematology patient would benefit from infusion has already tried oral and not recovering well hemoglobin going down  3. Hyperlipidemia, unspecified hyperlipidemia type Improvement some patient will work hard on diet   Prediabetes-patient working hard on diet Morbid obesity-patient working hard on diet and activity  Follow-up 4 months

## 2023-10-22 NOTE — Telephone Encounter (Signed)
 Please schedule patient end of the day she prefers Wednesdays July would be fine for myself for ADD follow-up

## 2023-10-23 ENCOUNTER — Other Ambulatory Visit: Payer: Self-pay

## 2023-10-23 NOTE — Addendum Note (Signed)
 Addended by: Sande Brothers on: 10/23/2023 08:42 AM   Modules accepted: Orders

## 2023-10-24 ENCOUNTER — Other Ambulatory Visit (HOSPITAL_COMMUNITY): Payer: Self-pay

## 2023-10-27 ENCOUNTER — Other Ambulatory Visit: Payer: Self-pay

## 2023-10-31 ENCOUNTER — Encounter: Payer: Self-pay | Admitting: Oncology

## 2023-10-31 ENCOUNTER — Inpatient Hospital Stay: Attending: Oncology | Admitting: Oncology

## 2023-10-31 ENCOUNTER — Inpatient Hospital Stay

## 2023-10-31 VITALS — BP 133/85 | HR 81 | Temp 97.9°F | Resp 16 | Ht 70.0 in | Wt 265.9 lb

## 2023-10-31 VITALS — BP 122/69 | HR 77 | Temp 99.8°F | Resp 18

## 2023-10-31 DIAGNOSIS — R5383 Other fatigue: Secondary | ICD-10-CM | POA: Diagnosis not present

## 2023-10-31 DIAGNOSIS — Z79624 Long term (current) use of inhibitors of nucleotide synthesis: Secondary | ICD-10-CM | POA: Insufficient documentation

## 2023-10-31 DIAGNOSIS — D509 Iron deficiency anemia, unspecified: Secondary | ICD-10-CM | POA: Diagnosis not present

## 2023-10-31 DIAGNOSIS — Z79899 Other long term (current) drug therapy: Secondary | ICD-10-CM | POA: Insufficient documentation

## 2023-10-31 DIAGNOSIS — N920 Excessive and frequent menstruation with regular cycle: Secondary | ICD-10-CM | POA: Diagnosis not present

## 2023-10-31 DIAGNOSIS — Z7951 Long term (current) use of inhaled steroids: Secondary | ICD-10-CM | POA: Diagnosis not present

## 2023-10-31 DIAGNOSIS — K5900 Constipation, unspecified: Secondary | ICD-10-CM | POA: Diagnosis not present

## 2023-10-31 MED ORDER — SODIUM CHLORIDE 0.9 % IV SOLN: INTRAVENOUS | Status: DC

## 2023-10-31 MED ORDER — IRON SUCROSE 20 MG/ML IV SOLN
500.0000 mg | Freq: Once | INTRAVENOUS | Status: AC
  Administered 2023-10-31: 500 mg via INTRAVENOUS
  Filled 2023-10-31: qty 20

## 2023-10-31 NOTE — Assessment & Plan Note (Signed)
 Patient reports heavy menstrual cycles for a long time.  Likely etiology for iron deficiency anemia.  Reported poor tolerance to oral contraceptive pills -Follow-up with the GYN for further assessment and management.

## 2023-10-31 NOTE — Patient Instructions (Signed)
 VISIT SUMMARY:  You came in today for management of your iron deficiency anemia. You mentioned experiencing mild to moderate fatigue but no shortness of breath, exertional symptoms, or dizziness. You have a history of iron deficiency anemia and have previously received IV iron treatment. You are currently taking oral iron every other day.  YOUR PLAN:  -IRON DEFICIENCY ANEMIA: Iron deficiency anemia is a condition where your body lacks enough iron to produce healthy red blood cells, leading to fatigue and other symptoms. Your anemia is likely due to heavy menstrual bleeding. Today, you received an iron infusion to help increase your iron levels. We will recheck your labs in six weeks to see how you are responding to the treatment. Continue taking your oral iron every other day with orange juice to help with absorption, and avoid coffee as it can interfere with iron absorption. We also recommend that you follow up with a gynecologist to address your heavy menstrual bleeding.  INSTRUCTIONS:  Please schedule a follow-up appointment in six weeks for lab tests to assess your response to the iron infusion. Additionally, make an appointment with a gynecologist to discuss your heavy menstrual bleeding.

## 2023-10-31 NOTE — Patient Instructions (Signed)
 [facility] [place] - A DEPT OF [facility]  Discharge Instructions: Thank you for choosing [facility] to provide your oncology and hematology care.  If you have a lab appointment with the Cancer Center - please note that after April 8th, 2024, all labs will be drawn in the cancer center.  You do not have to check in or register with the main entrance as you have in the past but will complete your check-in in the cancer center.  Wear comfortable clothing and clothing appropriate for easy access to any Portacath or PICC line.   We strive to give you quality time with your provider. You may need to reschedule your appointment if you arrive late (15 or more minutes).  Arriving late affects you and other patients whose appointments are after yours.  Also, if you miss three or more appointments without notifying the office, you may be dismissed from the clinic at the provider's discretion.      For prescription refill requests, have your pharmacy contact our office and allow 72 hours for refills to be completed.    Today you received the following iron infusion: Venofer 500 mg   To help prevent nausea and vomiting after your treatment, we encourage you to take your nausea medication as directed.  BELOW ARE SYMPTOMS THAT SHOULD BE REPORTED IMMEDIATELY: *FEVER GREATER THAN 100.4 F (38 C) OR HIGHER *CHILLS OR SWEATING *NAUSEA AND VOMITING THAT IS NOT CONTROLLED WITH YOUR NAUSEA MEDICATION *UNUSUAL SHORTNESS OF BREATH *UNUSUAL BRUISING OR BLEEDING *URINARY PROBLEMS (pain or burning when urinating, or frequent urination) *BOWEL PROBLEMS (unusual diarrhea, constipation, pain near the anus) TENDERNESS IN MOUTH AND THROAT WITH OR WITHOUT PRESENCE OF ULCERS (sore throat, sores in mouth, or a toothache) UNUSUAL RASH, SWELLING OR PAIN  UNUSUAL VAGINAL DISCHARGE OR ITCHING   Items with * indicate a potential emergency and should be followed up as soon as possible or  go to the Emergency Department if any problems should occur.  Please show the CHEMOTHERAPY ALERT CARD or IMMUNOTHERAPY ALERT CARD at check-in to the Emergency Department and triage nurse.  Should you have questions after your visit or need to cancel or reschedule your appointment, please contact [facility] [place] - A DEPT OF [facility] [phone]  and follow the prompts.  Office hours are 8:00 a.m. to 4:30 p.m. Monday - Friday. Please note that voicemails left after 4:00 p.m. may not be returned until the following business day.  We are closed weekends and major holidays. You have access to a nurse at all times for urgent questions. Please call the main number to the clinic [phone] and follow the prompts.  For any non-urgent questions, you may also contact your provider using MyChart. We now offer e-Visits for anyone 6 and older to request care online for non-urgent symptoms. For details visit [email or web address].   Also download the MyChart app! Go to the app store, search "MyChart", open the app, select [place], and log in with your MyChart username and password.

## 2023-10-31 NOTE — Progress Notes (Signed)
 Patient took her pre-meds this am.   Iron infusion given per orders. Patient tolerated it well without problems. Vitals stable and discharged home from clinic ambulatory. Follow up as scheduled.

## 2023-10-31 NOTE — Assessment & Plan Note (Signed)
 Chronic iron deficiency anemia likely secondary to menorrhagia.  Iron labs as above.  Patient is currently taking ferrous sulfate every day but is poorly tolerating it with constipation. -The most likely cause of her anemia is due to chronic blood loss.We discussed some of the risks, benefits, and alternatives of intravenous iron infusions. The patient is symptomatic from anemia and the iron level is critically low. She tolerated oral iron supplement poorly and desires to achieved higher levels of iron faster for adequate hematopoesis. Some of the side-effects to be expected including risks of infusion reactions, phlebitis, headaches, nausea and fatigue.  The patient is willing to proceed. Patient education material was dispensed.  Goal is to keep ferritin level greater than 50 and resolution of anemia - Decrease ferrous sulfate to every other day.  Take MiraLAX for constipation. - Eat diet with high-protein and greens - Recommend follow-up with GYN for menorrhagia.  Return to clinic in 6 weeks with labs for assessing response to iron

## 2023-10-31 NOTE — Progress Notes (Signed)
 Patient presents today for 1st Venofer 500 mg infusion and follow up visit with Dr. Anders Simmonds. Vital signs stable. Patient states 1000 mg of Tylenol and 10 mg of Cetirizine taken prior to arrival.

## 2023-10-31 NOTE — Progress Notes (Signed)
 [facility] at [facility]  HEMATOLOGY NEW VISIT  Babs Sciara, MD  REASON FOR REFERRAL: Iron deficiency anemia  SUMMARY OF HEMATOLOGIC HISTORY: Iron deficiency anemia:  - Likely secondary to menorrhagia  - S/p IV Feraheme 1 g in 2020   HISTORY OF PRESENT ILLNESS:  Cindy Hamilton 38 y.o. female referred for iron deficiency anemia. She reports mild to moderate fatigue, but denies shortness of breath, exertional symptoms, and dizziness. She has a history of iron deficiency anemia, having received IV feraheme in 2020 or 2021 when her hemoglobin was 7.8. She has been anemic for a while, with a history of menorrhagia, including clots. She denies any family history of bleeding disorders or any personal history of prolonged bleeding from cuts. She is currently taking oral iron once a day, every day.  She has no other complaints.  Is currently doing well.  Denies smoking, very occasional alcohol use.  Has no family history of colon cancer.  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  I have reviewed the past medical history, past surgical history, social history and family history with the patient   ALLERGIES:  is allergic to zinc.  MEDICATIONS:  Current Outpatient Medications  Medication Sig Dispense Refill   albuterol (VENTOLIN HFA) 108 (90 Base) MCG/ACT inhaler Inhale 2 puffs into the lungs every 6 (six) hours as needed for wheezing or shortness of breath. 18 g 0   cholecalciferol (VITAMIN D3) 25 MCG (1000 UNIT) tablet Take 1,000 Units by mouth daily.     famotidine (PEPCID) 20 MG tablet Take 1 tablet (20 mg total) by mouth 2 (two) times daily. (Patient taking differently: Take 20 mg by mouth as directed. As needed at bedtime.) 90 tablet 1   ferrous sulfate 325 (65 FE) MG EC tablet Take 1 tablet (325 mg total) by mouth daily. 90 tablet 2   FLUoxetine (PROZAC) 10 MG capsule Take 1 capsule (10 mg total) by mouth daily. 30 capsule 0   fluticasone  (FLOVENT HFA) 110 MCG/ACT inhaler Use 2 puffs twice a day as directed to help control reactive airway 12 g 12   methylphenidate (METADATE CD) 20 MG CR capsule Take 1 capsule (20 mg total) by mouth every morning. 30 capsule 0   methylphenidate (METADATE CD) 20 MG CR capsule Take 1 capsule (20 mg total) by mouth every morning. 30 capsule 0   methylphenidate (METADATE CD) 20 MG CR capsule Take 1 capsule (20 mg total) by mouth every morning. 30 capsule 0   valACYclovir (VALTREX) 1000 MG tablet Take 2 tablets by mouth at first sign of a cold sore. Repeat in 12 hours. Use as needed as directed 60 tablet 2   No current facility-administered medications for this visit.   Facility-Administered Medications Ordered in Other Visits  Medication Dose Route Frequency Provider Last Rate Last Admin   0.9 %  sodium chloride infusion   Intravenous Continuous Cindie Crumbly, MD 10 mL/hr at 10/31/23 0900 New Bag at 10/31/23 0900   iron sucrose (VENOFER) 500 mg in sodium chloride 0.9 % 250 mL IVPB  500 mg Intravenous Once Cindie Crumbly, MD 78.6 mL/hr at 10/31/23 0921 500 mg at 10/31/23 1478     REVIEW OF SYSTEMS:   Constitutional: Denies fevers, chills or night sweats Eyes: Denies blurriness of vision Ears, nose, mouth, throat, and face: Denies mucositis or sore throat Respiratory: Denies cough, dyspnea or wheezes Cardiovascular: Denies palpitation, chest discomfort or lower extremity swelling Gastrointestinal:  Denies  nausea, heartburn or change in bowel habits Skin: Denies abnormal skin rashes Lymphatics: Denies new lymphadenopathy or easy bruising Neurological:Denies numbness, tingling or new weaknesses Behavioral/Psych: Mood is stable, no new changes  All other systems were reviewed with the patient and are negative.  PHYSICAL EXAMINATION:   Vitals:   10/31/23 0819  BP: 133/85  Pulse: 81  Resp: 16  Temp: 97.9 F (36.6 C)  SpO2: 97%    GENERAL:alert, no distress and comfortable LUNGS:  clear to auscultation and percussion with normal breathing effort HEART: regular rate & rhythm and no murmurs and no lower extremity edema ABDOMEN:abdomen soft, non-tender and normal bowel sounds Musculoskeletal:no cyanosis of digits and no clubbing  NEURO: alert & oriented x 3 with fluent speech  LABORATORY DATA:  I have reviewed the data as listed  Lab Results  Component Value Date   WBC 5.0 10/10/2023   NEUTROABS 2.7 10/10/2023   HGB 10.9 (L) 10/10/2023   HCT 33.8 (L) 10/10/2023   MCV 82 10/10/2023   PLT 296 10/10/2023       Chemistry      Component Value Date/Time   NA 138 09/06/2022 0835   K 4.4 09/06/2022 0835   CL 102 09/06/2022 0835   CO2 24 09/06/2022 0835   BUN 7 09/06/2022 0835   CREATININE 0.63 09/06/2022 0835   CREATININE 0.66 03/11/2022 0730      Component Value Date/Time   CALCIUM 9.3 09/06/2022 0835   ALKPHOS 97 09/06/2022 0835   AST 19 09/06/2022 0835   ALT 18 09/06/2022 0835   BILITOT 0.2 09/06/2022 0835       Latest Reference Range & Units 10/10/23 09:23  Iron 27 - 159 ug/dL 47  UIBC 161 - 096 ug/dL 045  TIBC 409 - 811 ug/dL 914  Ferritin 15 - 782 ng/mL 32  Iron Saturation 15 - 55 % 15   ASSESSMENT & PLAN:  Patient is a 37 year old female with history of menorrhagia referred for iron deficiency anemia   Iron deficiency anemia Chronic iron deficiency anemia likely secondary to menorrhagia.  Iron labs as above.  Patient is currently taking ferrous sulfate every day but is poorly tolerating it with constipation. -The most likely cause of her anemia is due to chronic blood loss.We discussed some of the risks, benefits, and alternatives of intravenous iron infusions. The patient is symptomatic from anemia and the iron level is critically low. She tolerated oral iron supplement poorly and desires to achieved higher levels of iron faster for adequate hematopoesis. Some of the side-effects to be expected including risks of infusion reactions, phlebitis,  headaches, nausea and fatigue.  The patient is willing to proceed. Patient education material was dispensed.  Goal is to keep ferritin level greater than 50 and resolution of anemia - Decrease ferrous sulfate to every other day.  Take MiraLAX for constipation. - Eat diet with high-protein and greens - Recommend follow-up with GYN for menorrhagia.  Return to clinic in 6 weeks with labs for assessing response to iron   Menorrhagia Patient reports heavy menstrual cycles for a long time.  Likely etiology for iron deficiency anemia.  Reported poor tolerance to oral contraceptive pills -Follow-up with the GYN for further assessment and management.   Orders Placed This Encounter  Procedures   Ferritin    Standing Status:   Future    Expected Date:   12/08/2023    Expiration Date:   10/30/2024   Folate    Standing Status:   Future  Expected Date:   12/08/2023    Expiration Date:   10/30/2024   Vitamin B12    Standing Status:   Future    Expected Date:   12/08/2023    Expiration Date:   10/30/2024   CBC with Differential/Platelet    Standing Status:   Future    Expected Date:   12/08/2023    Expiration Date:   10/30/2024   Comprehensive metabolic panel with GFR    Standing Status:   Future    Expected Date:   12/08/2023    Expiration Date:   10/30/2024   Iron and TIBC    Standing Status:   Future    Expected Date:   12/08/2023    Expiration Date:   10/30/2024    The total time spent in the appointment was 40 minutes encounter with patients including review of chart and various tests results, discussions about plan of care and coordination of care plan   All questions were answered. The patient knows to call the clinic with any problems, questions or concerns. No barriers to learning was detected.   Cindie Crumbly, MD 4/4/202512:08 PM

## 2023-11-01 ENCOUNTER — Other Ambulatory Visit (HOSPITAL_COMMUNITY): Payer: Self-pay

## 2023-11-03 ENCOUNTER — Other Ambulatory Visit (HOSPITAL_COMMUNITY): Payer: Self-pay

## 2023-11-04 ENCOUNTER — Other Ambulatory Visit (HOSPITAL_COMMUNITY): Payer: Self-pay

## 2023-11-06 ENCOUNTER — Other Ambulatory Visit (HOSPITAL_COMMUNITY): Payer: Self-pay

## 2023-11-06 MED FILL — Iron Sucrose Inj 20 MG/ML (Fe Equiv): INTRAVENOUS | Qty: 25 | Status: AC

## 2023-11-07 ENCOUNTER — Inpatient Hospital Stay

## 2023-11-07 VITALS — BP 116/71 | HR 68 | Temp 98.1°F | Resp 20

## 2023-11-07 DIAGNOSIS — Z7951 Long term (current) use of inhaled steroids: Secondary | ICD-10-CM | POA: Diagnosis not present

## 2023-11-07 DIAGNOSIS — K5900 Constipation, unspecified: Secondary | ICD-10-CM | POA: Diagnosis not present

## 2023-11-07 DIAGNOSIS — D509 Iron deficiency anemia, unspecified: Secondary | ICD-10-CM | POA: Diagnosis not present

## 2023-11-07 DIAGNOSIS — Z79899 Other long term (current) drug therapy: Secondary | ICD-10-CM | POA: Diagnosis not present

## 2023-11-07 DIAGNOSIS — N920 Excessive and frequent menstruation with regular cycle: Secondary | ICD-10-CM | POA: Diagnosis not present

## 2023-11-07 DIAGNOSIS — R5383 Other fatigue: Secondary | ICD-10-CM | POA: Diagnosis not present

## 2023-11-07 DIAGNOSIS — Z79624 Long term (current) use of inhibitors of nucleotide synthesis: Secondary | ICD-10-CM | POA: Diagnosis not present

## 2023-11-07 DIAGNOSIS — D500 Iron deficiency anemia secondary to blood loss (chronic): Secondary | ICD-10-CM

## 2023-11-07 MED ORDER — SODIUM CHLORIDE 0.9 % IV SOLN
500.0000 mg | Freq: Once | INTRAVENOUS | Status: AC
  Administered 2023-11-07: 500 mg via INTRAVENOUS
  Filled 2023-11-07: qty 20

## 2023-11-07 MED ORDER — SODIUM CHLORIDE 0.9 % IV SOLN: INTRAVENOUS | Status: DC

## 2023-11-07 NOTE — Patient Instructions (Signed)
 [facility] [place] - A DEPT OF [facility]  Discharge Instructions: Thank you for choosing [facility] to provide your oncology and hematology care.  If you have a lab appointment with the Cancer Center - please note that after April 8th, 2024, all labs will be drawn in the cancer center.  You do not have to check in or register with the main entrance as you have in the past but will complete your check-in in the cancer center.  Wear comfortable clothing and clothing appropriate for easy access to any Portacath or PICC line.   We strive to give you quality time with your provider. You may need to reschedule your appointment if you arrive late (15 or more minutes).  Arriving late affects you and other patients whose appointments are after yours.  Also, if you miss three or more appointments without notifying the office, you may be dismissed from the clinic at the provider's discretion.      For prescription refill requests, have your pharmacy contact our office and allow 72 hours for refills to be completed.    Today you received the following venofer iron infusion   To help prevent nausea and vomiting after your treatment, we encourage you to take your nausea medication as directed.  BELOW ARE SYMPTOMS THAT SHOULD BE REPORTED IMMEDIATELY: *FEVER GREATER THAN 100.4 F (38 C) OR HIGHER *CHILLS OR SWEATING *NAUSEA AND VOMITING THAT IS NOT CONTROLLED WITH YOUR NAUSEA MEDICATION *UNUSUAL SHORTNESS OF BREATH *UNUSUAL BRUISING OR BLEEDING *URINARY PROBLEMS (pain or burning when urinating, or frequent urination) *BOWEL PROBLEMS (unusual diarrhea, constipation, pain near the anus) TENDERNESS IN MOUTH AND THROAT WITH OR WITHOUT PRESENCE OF ULCERS (sore throat, sores in mouth, or a toothache) UNUSUAL RASH, SWELLING OR PAIN  UNUSUAL VAGINAL DISCHARGE OR ITCHING   Items with * indicate a potential emergency and should be followed up as soon as possible or go to  the Emergency Department if any problems should occur.  Please show the CHEMOTHERAPY ALERT CARD or IMMUNOTHERAPY ALERT CARD at check-in to the Emergency Department and triage nurse.  Should you have questions after your visit or need to cancel or reschedule your appointment, please contact [facility] [place] - A DEPT OF Eligha Brideg[facility] [phone]  and follow the prompts.  Office hours are 8:00 a.m. to 4:30 p.m. Monday - Friday. Please note that voicemails left after 4:00 p.m. may not be returned until the following business day.  We are closed weekends and major holidays. You have access to a nurse at all times for urgent questions. Please call the main number to the clinic [phone] and follow the prompts.  For any non-urgent questions, you may also contact your provider using MyChart. We now offer e-Visits for anyone 37 and older to request care online for non-urgent symptoms. For details visit [email or web address].   Also download the MyChart app! Go to the app store, search "MyChart", open the app, select [facility], and log in with your MyChart username and password.

## 2023-11-07 NOTE — Progress Notes (Signed)
 Pre-medications taken today at 0800.    Venofer iron infusion given per orders. Patient tolerated it well without problems. Vitals stable and discharged home from clinic ambulatory. Follow up as scheduled.

## 2023-11-11 DIAGNOSIS — G4733 Obstructive sleep apnea (adult) (pediatric): Secondary | ICD-10-CM | POA: Diagnosis not present

## 2023-11-21 ENCOUNTER — Other Ambulatory Visit (HOSPITAL_COMMUNITY): Payer: Self-pay

## 2023-12-01 ENCOUNTER — Inpatient Hospital Stay: Attending: Oncology

## 2023-12-01 DIAGNOSIS — Z79624 Long term (current) use of inhibitors of nucleotide synthesis: Secondary | ICD-10-CM | POA: Insufficient documentation

## 2023-12-01 DIAGNOSIS — D509 Iron deficiency anemia, unspecified: Secondary | ICD-10-CM | POA: Diagnosis not present

## 2023-12-01 DIAGNOSIS — K5900 Constipation, unspecified: Secondary | ICD-10-CM | POA: Insufficient documentation

## 2023-12-01 DIAGNOSIS — N920 Excessive and frequent menstruation with regular cycle: Secondary | ICD-10-CM | POA: Diagnosis not present

## 2023-12-01 DIAGNOSIS — Z7951 Long term (current) use of inhaled steroids: Secondary | ICD-10-CM | POA: Diagnosis not present

## 2023-12-01 DIAGNOSIS — Z79899 Other long term (current) drug therapy: Secondary | ICD-10-CM | POA: Insufficient documentation

## 2023-12-01 LAB — COMPREHENSIVE METABOLIC PANEL WITH GFR
ALT: 20 U/L (ref 0–44)
AST: 20 U/L (ref 15–41)
Albumin: 3.8 g/dL (ref 3.5–5.0)
Alkaline Phosphatase: 74 U/L (ref 38–126)
Anion gap: 11 (ref 5–15)
BUN: 11 mg/dL (ref 6–20)
CO2: 24 mmol/L (ref 22–32)
Calcium: 9.2 mg/dL (ref 8.9–10.3)
Chloride: 103 mmol/L (ref 98–111)
Creatinine, Ser: 0.63 mg/dL (ref 0.44–1.00)
GFR, Estimated: >60 mL/min (ref >60)
Glucose, Bld: 95 mg/dL (ref 70–99)
Potassium: 4.2 mmol/L (ref 3.5–5.1)
Sodium: 138 mmol/L (ref 135–145)
Total Bilirubin: 0.3 mg/dL (ref 0.0–1.2)
Total Protein: 7.1 g/dL (ref 6.5–8.1)

## 2023-12-01 LAB — CBC WITH DIFFERENTIAL/PLATELET
Abs Immature Granulocytes: 0.02 10*3/uL (ref 0.00–0.07)
Basophils Absolute: 0 10*3/uL (ref 0.0–0.1)
Basophils Relative: 0 %
Eosinophils Absolute: 0.2 10*3/uL (ref 0.0–0.5)
Eosinophils Relative: 4 %
HCT: 37.1 % (ref 36.0–46.0)
Hemoglobin: 11.9 g/dL — ABNORMAL LOW (ref 12.0–15.0)
Immature Granulocytes: 0 %
Lymphocytes Relative: 25 %
Lymphs Abs: 1.7 10*3/uL (ref 0.7–4.0)
MCH: 27.2 pg (ref 26.0–34.0)
MCHC: 32.1 g/dL (ref 30.0–36.0)
MCV: 84.7 fL (ref 80.0–100.0)
Monocytes Absolute: 0.4 10*3/uL (ref 0.1–1.0)
Monocytes Relative: 7 %
Neutro Abs: 4.4 10*3/uL (ref 1.7–7.7)
Neutrophils Relative %: 64 %
Platelets: 264 10*3/uL (ref 150–400)
RBC: 4.38 MIL/uL (ref 3.87–5.11)
RDW: 14.5 % (ref 11.5–15.5)
WBC: 6.8 10*3/uL (ref 4.0–10.5)
nRBC: 0 % (ref 0.0–0.2)

## 2023-12-01 LAB — IRON AND TIBC
Iron: 76 ug/dL (ref 28–170)
Saturation Ratios: 23 % (ref 10.4–31.8)
TIBC: 331 ug/dL (ref 250–450)
UIBC: 255 ug/dL

## 2023-12-01 LAB — FERRITIN: Ferritin: 185 ng/mL (ref 11–307)

## 2023-12-01 LAB — FOLATE: Folate: 9.4 ng/mL (ref >5.9)

## 2023-12-01 LAB — VITAMIN B12: Vitamin B-12: 421 pg/mL (ref 180–914)

## 2023-12-03 ENCOUNTER — Other Ambulatory Visit (HOSPITAL_COMMUNITY): Payer: Self-pay

## 2023-12-04 ENCOUNTER — Encounter: Payer: Self-pay | Admitting: Family Medicine

## 2023-12-05 ENCOUNTER — Other Ambulatory Visit: Payer: Self-pay | Admitting: Nurse Practitioner

## 2023-12-06 ENCOUNTER — Other Ambulatory Visit: Payer: Self-pay | Admitting: Nurse Practitioner

## 2023-12-06 ENCOUNTER — Other Ambulatory Visit (HOSPITAL_COMMUNITY): Payer: Self-pay

## 2023-12-06 MED ORDER — METHYLPHENIDATE HCL ER (OSM) 27 MG PO TBCR
27.0000 mg | EXTENDED_RELEASE_TABLET | ORAL | 0 refills | Status: DC
  Filled 2023-12-06 (×2): qty 30, 30d supply, fill #0

## 2023-12-06 MED ORDER — AMPHETAMINE-DEXTROAMPHET ER 20 MG PO CP24
20.0000 mg | ORAL_CAPSULE | ORAL | 0 refills | Status: DC
  Filled 2023-12-06: qty 30, 30d supply, fill #0

## 2023-12-08 ENCOUNTER — Inpatient Hospital Stay (HOSPITAL_BASED_OUTPATIENT_CLINIC_OR_DEPARTMENT_OTHER): Admitting: Oncology

## 2023-12-08 ENCOUNTER — Other Ambulatory Visit (HOSPITAL_COMMUNITY): Payer: Self-pay

## 2023-12-08 ENCOUNTER — Other Ambulatory Visit: Payer: Self-pay

## 2023-12-08 VITALS — BP 123/74 | HR 71 | Temp 98.1°F | Resp 16

## 2023-12-08 DIAGNOSIS — D509 Iron deficiency anemia, unspecified: Secondary | ICD-10-CM | POA: Diagnosis not present

## 2023-12-08 DIAGNOSIS — N920 Excessive and frequent menstruation with regular cycle: Secondary | ICD-10-CM | POA: Diagnosis not present

## 2023-12-08 DIAGNOSIS — K5900 Constipation, unspecified: Secondary | ICD-10-CM | POA: Diagnosis not present

## 2023-12-08 DIAGNOSIS — Z7951 Long term (current) use of inhaled steroids: Secondary | ICD-10-CM | POA: Diagnosis not present

## 2023-12-08 DIAGNOSIS — D500 Iron deficiency anemia secondary to blood loss (chronic): Secondary | ICD-10-CM

## 2023-12-08 DIAGNOSIS — Z79624 Long term (current) use of inhibitors of nucleotide synthesis: Secondary | ICD-10-CM | POA: Diagnosis not present

## 2023-12-08 DIAGNOSIS — Z79899 Other long term (current) drug therapy: Secondary | ICD-10-CM | POA: Diagnosis not present

## 2023-12-08 NOTE — Progress Notes (Signed)
 [facility]  HEMATOLOGY FOLLOW-UP VISIT  Bennet Brasil, MD  REASON FOR FOLLOW-UP: Iron  deficiency anemia  ASSESSMENT & PLAN:  Patient is a 38 y.o. female following for iron  deficiency anemia  Iron  deficiency anemia Chronic iron  deficiency anemia likely secondary to menorrhagia. S/p IV iron  with improvement in ferritin, TSAT and hemoglobin  -Continue ferrous sulfate  to every other day.  Take MiraLAX for constipation. - Eat diet with high-protein and greens - Recommended follow-up with GYN for menorrhagia.  Return to clinic in 6 months with labs  Menorrhagia Patient reports heavy menstrual cycles for a long time.  Likely etiology for iron  deficiency anemia.   Reported poor tolerance to oral contraceptive pills  -Follow-up with the GYN for further assessment and management.   Orders Placed This Encounter  Procedures   Ferritin    Standing Status:   Future    Expected Date:   06/07/2024    Expiration Date:   12/07/2024   Folate    Standing Status:   Future    Expected Date:   06/07/2024    Expiration Date:   12/07/2024   Vitamin B12    Standing Status:   Future    Expected Date:   06/07/2024    Expiration Date:   12/07/2024   CBC with Differential/Platelet    Standing Status:   Future    Expected Date:   06/07/2024    Expiration Date:   12/07/2024   Comprehensive metabolic panel with GFR    Standing Status:   Future    Expected Date:   06/07/2024    Expiration Date:   12/07/2024   Iron  and TIBC    Standing Status:   Future    Expected Date:   06/07/2024    Expiration Date:   12/07/2024    The total time spent in the appointment was 20 minutes encounter with patients including review of chart and various tests results, discussions about plan of care and coordination of care plan   All questions were answered. The patient knows to call the clinic with any problems, questions or concerns. No barriers to learning was  detected.  Eduardo Grade, MD 5/12/20253:02 PM   SUMMARY OF HEMATOLOGIC HISTORY: Iron  deficiency anemia likely secondary to menstrual bleeding - S/p IV Venofer  500 mg X 2 doses on 10/31/2023 and 11/07/2023   INTERVAL HISTORY: Cindy Hamilton 38 y.o. female following for iron  deficiency anemia.  Patient has no complaints today and has felt significantly better after IV iron  infusion.  Overall doing very well.  Did not see her GYN since our last visit.  I have reviewed the past medical history, past surgical history, social history and family history with the patient   ALLERGIES:  is allergic to zinc.  MEDICATIONS:  Current Outpatient Medications  Medication Sig Dispense Refill   albuterol  (VENTOLIN  HFA) 108 (90 Base) MCG/ACT inhaler Inhale 2 puffs into the lungs every 6 (six) hours as needed for wheezing or shortness of breath. 18 g 0   cholecalciferol (VITAMIN D3) 25 MCG (1000 UNIT) tablet Take 1,000 Units by mouth daily.     famotidine  (PEPCID ) 20 MG tablet Take 1 tablet (20 mg total) by mouth 2 (two) times daily. (Patient taking differently: Take 20 mg by mouth as directed. As needed at bedtime.) 90 tablet 1   ferrous sulfate  325 (65 FE) MG EC tablet Take 1 tablet (325 mg total) by mouth daily. 90 tablet 2   FLUoxetine  (PROZAC )  10 MG capsule Take 1 capsule (10 mg total) by mouth daily. 30 capsule 0   fluticasone  (FLOVENT  HFA) 110 MCG/ACT inhaler Use 2 puffs twice a day as directed to help control reactive airway 12 g 12   methylphenidate  (CONCERTA ) 27 MG PO CR tablet Take 1 tablet (27 mg total) by mouth every morning. 30 tablet 0   valACYclovir  (VALTREX ) 1000 MG tablet Take 2 tablets by mouth at first sign of a cold sore. Repeat in 12 hours. Use as needed as directed 60 tablet 2   No current facility-administered medications for this visit.     REVIEW OF SYSTEMS:   Constitutional: Denies fevers, chills or night sweats Eyes: Denies blurriness of vision Ears, nose, mouth, throat,  and face: Denies mucositis or sore throat Respiratory: Denies cough, dyspnea or wheezes Cardiovascular: Denies palpitation, chest discomfort or lower extremity swelling Gastrointestinal:  Denies nausea, heartburn or change in bowel habits Skin: Denies abnormal skin rashes Lymphatics: Denies new lymphadenopathy or easy bruising Neurological:Denies numbness, tingling or new weaknesses Behavioral/Psych: Mood is stable, no new changes  All other systems were reviewed with the patient and are negative.  PHYSICAL EXAMINATION:   Vitals:   12/08/23 1440  BP: 123/74  Pulse: 71  Resp: 16  Temp: 98.1 F (36.7 C)  SpO2: 100%    GENERAL:alert, no distress and comfortable SKIN: skin color, texture, turgor are normal, no rashes or significant lesions LUNGS: clear to auscultation and percussion with normal breathing effort HEART: regular rate & rhythm and no murmurs and no lower extremity edema ABDOMEN:abdomen soft, non-tender and normal bowel sounds Musculoskeletal:no cyanosis of digits and no clubbing  NEURO: alert & oriented x 3 with fluent speech  LABORATORY DATA:  I have reviewed the data as listed  Lab Results  Component Value Date   WBC 6.8 12/01/2023   NEUTROABS 4.4 12/01/2023   HGB 11.9 (L) 12/01/2023   HCT 37.1 12/01/2023   MCV 84.7 12/01/2023   PLT 264 12/01/2023       Chemistry      Component Value Date/Time   NA 138 12/01/2023 1503   NA 138 09/06/2022 0835   K 4.2 12/01/2023 1503   CL 103 12/01/2023 1503   CO2 24 12/01/2023 1503   BUN 11 12/01/2023 1503   BUN 7 09/06/2022 0835   CREATININE 0.63 12/01/2023 1503   CREATININE 0.66 03/11/2022 0730      Component Value Date/Time   CALCIUM 9.2 12/01/2023 1503   ALKPHOS 74 12/01/2023 1503   AST 20 12/01/2023 1503   ALT 20 12/01/2023 1503   BILITOT 0.3 12/01/2023 1503   BILITOT 0.2 09/06/2022 0835       Latest Reference Range & Units 12/01/23 15:03  Iron  28 - 170 ug/dL 76  UIBC ug/dL 161  TIBC 096 - 045  ug/dL 409  Saturation Ratios 10.4 - 31.8 % 23  Ferritin 11 - 307 ng/mL 185  Folate >5.9 ng/mL 9.4  Vitamin B12 180 - 914 pg/mL 421    RADIOGRAPHIC STUDIES: I have personally reviewed the radiological images as listed and agreed with the findings in the report. None to review

## 2023-12-08 NOTE — Assessment & Plan Note (Signed)
 Patient reports heavy menstrual cycles for a long time.  Likely etiology for iron deficiency anemia.  Reported poor tolerance to oral contraceptive pills -Follow-up with the GYN for further assessment and management.

## 2023-12-08 NOTE — Assessment & Plan Note (Signed)
 Chronic iron  deficiency anemia likely secondary to menorrhagia. S/p IV iron  with improvement in ferritin, TSAT and hemoglobin  -Continue ferrous sulfate  to every other day.  Take MiraLAX for constipation. - Eat diet with high-protein and greens - Recommended follow-up with GYN for menorrhagia.  Return to clinic in 6 months with labs

## 2023-12-11 DIAGNOSIS — G4733 Obstructive sleep apnea (adult) (pediatric): Secondary | ICD-10-CM | POA: Diagnosis not present

## 2023-12-12 DIAGNOSIS — G4733 Obstructive sleep apnea (adult) (pediatric): Secondary | ICD-10-CM | POA: Diagnosis not present

## 2024-01-07 ENCOUNTER — Other Ambulatory Visit: Payer: Self-pay | Admitting: Nurse Practitioner

## 2024-01-07 ENCOUNTER — Other Ambulatory Visit: Payer: Self-pay | Admitting: Family Medicine

## 2024-01-07 ENCOUNTER — Other Ambulatory Visit (HOSPITAL_COMMUNITY): Payer: Self-pay

## 2024-01-08 ENCOUNTER — Other Ambulatory Visit (HOSPITAL_COMMUNITY): Payer: Self-pay

## 2024-01-09 ENCOUNTER — Other Ambulatory Visit: Payer: Self-pay | Admitting: Family Medicine

## 2024-01-09 ENCOUNTER — Other Ambulatory Visit: Payer: Self-pay

## 2024-01-09 ENCOUNTER — Other Ambulatory Visit (HOSPITAL_COMMUNITY): Payer: Self-pay

## 2024-01-09 MED ORDER — FLUTICASONE PROPIONATE HFA 110 MCG/ACT IN AERO
INHALATION_SPRAY | RESPIRATORY_TRACT | 12 refills | Status: DC
  Filled 2024-01-09 – 2024-01-13 (×2): qty 12, 30d supply, fill #0

## 2024-01-09 MED ORDER — METHYLPHENIDATE HCL ER (OSM) 27 MG PO TBCR
27.0000 mg | EXTENDED_RELEASE_TABLET | ORAL | 0 refills | Status: DC
Start: 2024-01-09 — End: 2024-02-25
  Filled 2024-01-09: qty 30, 30d supply, fill #0

## 2024-01-09 MED ORDER — METHYLPHENIDATE HCL ER 27 MG PO TB24
27.0000 mg | ORAL_TABLET | Freq: Every morning | ORAL | 0 refills | Status: AC
  Filled 2024-01-09 – 2024-02-10 (×2): qty 30, 30d supply, fill #0

## 2024-01-13 ENCOUNTER — Encounter: Payer: Self-pay | Admitting: Family Medicine

## 2024-01-13 ENCOUNTER — Other Ambulatory Visit (HOSPITAL_COMMUNITY): Payer: Self-pay

## 2024-01-14 ENCOUNTER — Other Ambulatory Visit: Payer: Self-pay

## 2024-01-15 ENCOUNTER — Other Ambulatory Visit: Payer: Self-pay

## 2024-01-15 ENCOUNTER — Other Ambulatory Visit (HOSPITAL_COMMUNITY): Payer: Self-pay

## 2024-01-15 MED ORDER — ALBUTEROL SULFATE HFA 108 (90 BASE) MCG/ACT IN AERS
2.0000 | INHALATION_SPRAY | Freq: Four times a day (QID) | RESPIRATORY_TRACT | 0 refills | Status: AC | PRN
  Filled 2024-01-15: qty 6.7, 25d supply, fill #0

## 2024-01-19 ENCOUNTER — Other Ambulatory Visit (HOSPITAL_COMMUNITY): Payer: Self-pay

## 2024-01-22 ENCOUNTER — Encounter: Payer: Self-pay | Admitting: Neurology

## 2024-02-10 ENCOUNTER — Encounter: Payer: Self-pay | Admitting: Family Medicine

## 2024-02-10 ENCOUNTER — Other Ambulatory Visit: Payer: Self-pay | Admitting: Family Medicine

## 2024-02-10 ENCOUNTER — Other Ambulatory Visit (HOSPITAL_COMMUNITY): Payer: Self-pay

## 2024-02-11 ENCOUNTER — Other Ambulatory Visit (HOSPITAL_COMMUNITY): Payer: Self-pay

## 2024-02-11 ENCOUNTER — Other Ambulatory Visit: Payer: Self-pay

## 2024-02-11 MED ORDER — FLUOXETINE HCL 10 MG PO CAPS
10.0000 mg | ORAL_CAPSULE | Freq: Every day | ORAL | 0 refills | Status: DC
  Filled 2024-02-11: qty 30, 30d supply, fill #0

## 2024-02-14 ENCOUNTER — Encounter: Payer: Self-pay | Admitting: Oncology

## 2024-02-14 ENCOUNTER — Ambulatory Visit
Admission: RE | Admit: 2024-02-14 | Discharge: 2024-02-14 | Disposition: A | Discharge status: Home / Self Care | Payer: Self-pay | Source: Ambulatory Visit | Attending: Emergency Medicine | Admitting: Emergency Medicine

## 2024-02-14 ENCOUNTER — Other Ambulatory Visit (HOSPITAL_COMMUNITY): Payer: Self-pay

## 2024-02-14 VITALS — BP 116/79 | HR 108 | Temp 99.7°F | Resp 20

## 2024-02-14 DIAGNOSIS — J069 Acute upper respiratory infection, unspecified: Secondary | ICD-10-CM | POA: Diagnosis not present

## 2024-02-14 LAB — POCT INFLUENZA A/B
Influenza A, POC: NEGATIVE
Influenza B, POC: NEGATIVE

## 2024-02-14 MED ORDER — PREDNISONE 10 MG (21) PO TBPK
ORAL_TABLET | ORAL | 0 refills | Status: DC
Start: 2024-02-14 — End: 2024-02-14
  Filled 2024-02-14: qty 21, 6d supply, fill #0

## 2024-02-14 MED ORDER — BENZONATATE 100 MG PO CAPS: 100.0000 mg | ORAL_CAPSULE | Freq: Three times a day (TID) | ORAL | 0 refills | Status: DC

## 2024-02-14 MED ORDER — PROMETHAZINE-DM 6.25-15 MG/5ML PO SYRP
5.0000 mL | ORAL_SOLUTION | Freq: Every evening | ORAL | 0 refills | Status: DC | PRN
  Filled 2024-02-14: qty 118, 23d supply, fill #0

## 2024-02-14 MED ORDER — PROMETHAZINE-DM 6.25-15 MG/5ML PO SYRP: 5.0000 mL | ORAL_SOLUTION | Freq: Every evening | ORAL | 0 refills | Status: DC | PRN

## 2024-02-14 MED ORDER — BENZONATATE 100 MG PO CAPS
100.0000 mg | ORAL_CAPSULE | Freq: Three times a day (TID) | ORAL | 0 refills | Status: DC
  Filled 2024-02-14: qty 21, 7d supply, fill #0

## 2024-02-14 MED ORDER — PREDNISONE 10 MG (21) PO TBPK: ORAL_TABLET | ORAL | 0 refills | Status: AC

## 2024-02-14 NOTE — ED Triage Notes (Signed)
 Patient reports fever x 1 day. Patient also report cough with green sputum x 3 days. Took Tylenol  last night. Took mucinex  and albuterol  inhaler. Patient also complains of SOB.

## 2024-02-14 NOTE — Discharge Instructions (Addendum)
 Your symptoms today are most likely being caused by a virus and should steadily improve in time it can take up to 7 to 10 days before you truly start to see a turnaround however things will get better  Flu Test negative  Begin prednisone  every morning with food to open and relax the airway, should help with persistent cough and shortness of breath  You may use Tessalon  pill every 8 hours as needed for cough, may use cough syrup at bedtime to help you rest    You can take Tylenol  and/or Ibuprofen as needed for fever reduction and pain relief.   For cough: honey 1/2 to 1 teaspoon (you can dilute the honey in water or another fluid).  You can also use guaifenesin  and dextromethorphan  for cough. You can use a humidifier for chest congestion and cough.  If you don't have a humidifier, you can sit in the bathroom with the hot shower running.      For sore throat: try warm salt water gargles, cepacol lozenges, throat spray, warm tea or water with lemon/honey, popsicles or ice, or OTC cold relief medicine for throat discomfort.   For congestion: take a daily anti-histamine like Zyrtec, Claritin, and a oral decongestant, such as pseudoephedrine.  You can also use Flonase  1-2 sprays in each nostril daily.   It is important to stay hydrated: drink plenty of fluids (water, gatorade/powerade/pedialyte, juices, or teas) to keep your throat moisturized and help further relieve irritation/discomfort.

## 2024-02-14 NOTE — ED Provider Notes (Signed)
 [facility]    CSN: [id number] Arrival date & time: 02/14/24  9057      History   Chief Complaint Chief Complaint  Patient presents with   Fever    Productive cough (yellowish sputum) since 7/16Fever since 7/18Scant nasal congestion (onest after cough)SOB and chest tightness (hx asthma)No known sick contacts - Entered by patient   Cough   Generalized Body Aches    HPI Cindy Hamilton is a 38 y.o. female.   Patient presents for evaluation of fever being at 102.7, nasal congestion, mild sinus pressure, productive cough with yellow to green sputum and shortness of breath after episodes of coughing present for 3 days.  No known sick contact prior.  Tolerating food and liquids.  Has attempted use of Tylenol , Mucinex  and albuterol  inhaler.  History of asthma, denies wheezing.  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   Calculus of gallbladder without cholecystitis without obstruction 04/11/2022   GERD (gastroesophageal reflux disease) 12/24/2021   Herpes labialis without complication 12/24/2021   Hx of cold sores 12/24/2021   History of anorexia nervosa 12/24/2021   Iron  deficiency anemia 12/24/2021   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    OB History   No obstetric history on file.      Home Medications    Prior to Admission medications   Medication Sig Start Date End Date Taking? Authorizing Provider  albuterol  (VENTOLIN  HFA) 108 (90 Base) MCG/ACT inhaler Inhale 2 puffs into the lungs every 6 (six) hours as needed for wheezing or shortness of breath. 01/15/24   Alphonsa Glendia LABOR, MD  benzonatate  (TESSALON ) 100 MG capsule Take 1 capsule (100 mg total) by mouth every 8 (eight) hours. 02/14/24   Teresa Shelba SAUNDERS, NP  cholecalciferol (VITAMIN D3) 25 MCG (1000 UNIT) tablet Take 1,000 Units by mouth daily.    [provider]  famotidine  (PEPCID ) 20 MG tablet Take 1 tablet (20 mg total) by mouth 2 (two) times daily. Patient taking differently: Take 20 mg by mouth as directed. As needed at bedtime. 01/03/22   Alphonsa Glendia LABOR, MD  ferrous sulfate  325 (65 FE) MG EC tablet Take 1 tablet (325 mg total) by mouth daily. 11/03/20   Vigg, Avanti,  MD  FLUoxetine  (PROZAC ) 10 MG capsule Take 1 capsule (10 mg total) by mouth daily. 02/11/24   Alphonsa Glendia LABOR, MD  fluticasone  (FLOVENT  HFA) 110 MCG/ACT inhaler Use 2 puffs twice a day as directed to help control reactive airway 01/09/24   Alphonsa Glendia LABOR, MD  methylphenidate  (CONCERTA ) 27 MG PO CR tablet Take 1 tablet (27 mg total) by mouth every morning. 01/09/24   Alphonsa Glendia LABOR, MD  methylphenidate  27 MG PO TB24 Take 1 tablet (27 mg total) by mouth in the morning as directed for ADD 01/09/24   Alphonsa Glendia LABOR, MD   predniSONE  (STERAPRED UNI-PAK 21 TAB) 10 MG (21) TBPK tablet Take 6 tablets (60 mg total) by mouth daily for 1 day, THEN 5 tablets (50 mg total) daily for 1 day, THEN 4 tablets (40 mg total) daily for 1 day, THEN 3 tablets (30 mg total) daily for 1 day, THEN 2 tablets (20 mg total) daily for 1 day, THEN 1 tablet (10 mg total) daily for 1 day. 02/14/24 02/20/24  Teresa Shelba SAUNDERS, NP  promethazine -dextromethorphan  (PROMETHAZINE -DM) 6.25-15 MG/5ML syrup Take 5 mLs by mouth at bedtime as needed. 02/14/24   Lacreshia Bondarenko, Shelba SAUNDERS, NP  valACYclovir  (VALTREX ) 1000 MG tablet Take 2 tablets by mouth at first sign of a cold sore. Repeat in 12 hours. Use as needed as directed 08/14/22   Alphonsa Glendia LABOR, MD    Family History Family History  Problem Relation Age of Onset   Osteopenia Father    Prostate cancer Father    Sleep apnea Father    Colon polyps Brother        unknown if adenomas   Cancer Paternal Aunt        Ovarian   Cancer Maternal Grandmother        Non-Hodgkin's lymphoma   Diabetes Maternal Grandfather    Heart disease Maternal Grandfather    Prostate cancer Paternal Grandfather    Colon cancer Neg Hx     Social History Social History   Tobacco Use   Smoking status: Never   Smokeless tobacco: Never  Vaping Use   Vaping status: Never Used  Substance Use Topics   Alcohol use: Never   Drug use: Never     Allergies   Zinc   Review of Systems Review of Systems   Physical Exam Triage Vital Signs ED Triage Vitals  Encounter Vitals Group     BP 02/14/24 0955 116/79     Girls Systolic BP Percentile --      Girls Diastolic BP Percentile --      Boys Systolic BP Percentile --      Boys Diastolic BP Percentile --      Pulse Rate 02/14/24 0955 (!) 108     Resp 02/14/24 0955 20     Temp 02/14/24 0955 99.7 F (37.6 C)     Temp Source 02/14/24 0955 Oral     SpO2 02/14/24 0955 95 %     Weight --      Height --      Head Circumference --      Peak Flow --      Pain Score  02/14/24 0954 4     Pain Loc --      Pain Education --      Exclude from Growth Chart --    No data found.  Updated Vital Signs BP 116/79 (BP Location: Left Arm)   Pulse (!) 108   Temp 99.7 F (37.6  C) (Oral)   Resp 20   LMP 01/29/2024 (Exact Date)   SpO2 95%   Visual Acuity Right Eye Distance:   Left Eye Distance:   Bilateral Distance:    Right Eye Near:   Left Eye Near:    Bilateral Near:     Physical Exam Constitutional:      Appearance: She is ill-appearing.  HENT:     Head: Normocephalic.     Right Ear: Tympanic membrane, ear canal and external ear normal.     Left Ear: Tympanic membrane, ear canal and external ear normal.     Nose: Congestion present.     Mouth/Throat:     Mouth: Mucous membranes are moist.     Pharynx: Oropharynx is clear.  Eyes:     Extraocular Movements: Extraocular movements intact.  Cardiovascular:     Rate and Rhythm: Normal rate and regular rhythm.     Pulses: Normal pulses.     Heart sounds: Normal heart sounds.  Pulmonary:     Effort: Pulmonary effort is normal.     Breath sounds: Normal breath sounds.  Musculoskeletal:     Cervical back: Normal range of motion and neck supple.  Neurological:     Mental Status: She is alert and oriented to person, place, and time. Mental status is at baseline.      UC Treatments / Results  Labs (all labs ordered are listed, but only abnormal results are displayed) Labs Reviewed  POCT INFLUENZA A/B - Normal    EKG   Radiology No results found.  Procedures Procedures (including critical care time)  Medications Ordered in UC Medications - No data to display  Initial Impression / Assessment and Plan / UC Course  I have reviewed the triage vital signs and the nursing notes.  Pertinent labs & imaging results that were available during my care of the patient were reviewed by me and considered in my medical decision making (see chart for details).  Acute URI  Patient is in no signs  of distress nor toxic appearing.  Vital signs are stable.  Low suspicion for pneumonia, pneumothorax or bronchitis and therefore will defer imaging.  Flu test negative.  Prescribed prednisone , Tessalon  and Promethazine  DM.May use additional over-the-counter medications as needed for supportive care.  May follow-up with urgent care as needed if symptoms persist or worsen.   Final Clinical Impressions(s) / UC Diagnoses   Final diagnoses:  Acute URI     Discharge Instructions      Your symptoms today are most likely being caused by a virus and should steadily improve in time it can take up to 7 to 10 days before you truly start to see a turnaround however things will get better  Flu Test negative  Begin prednisone  every morning with food to open and relax the airway, should help with persistent cough and shortness of breath  You may use Tessalon  pill every 8 hours as needed for cough, may use cough syrup at bedtime to help you rest    You can take Tylenol  and/or Ibuprofen as needed for fever reduction and pain relief.   For cough: honey 1/2 to 1 teaspoon (you can dilute the honey in water or another fluid).  You can also use guaifenesin  and dextromethorphan  for cough. You can use a humidifier for chest congestion and cough.  If you don't have a humidifier, you can sit in the bathroom with the hot shower running.      For sore throat:  try warm salt water gargles, cepacol lozenges, throat spray, warm tea or water with lemon/honey, popsicles or ice, or OTC cold relief medicine for throat discomfort.   For congestion: take a daily anti-histamine like Zyrtec, Claritin, and a oral decongestant, such as pseudoephedrine.  You can also use Flonase  1-2 sprays in each nostril daily.   It is important to stay hydrated: drink plenty of fluids (water, gatorade/powerade/pedialyte, juices, or teas) to keep your throat moisturized and help further relieve irritation/discomfort.    ED Prescriptions      Medication Sig Dispense Auth. Provider   predniSONE  (STERAPRED UNI-PAK 21 TAB) 10 MG (21) TBPK tablet  (Status: Discontinued) Take 6 tablets (60 mg total) by mouth daily for 1 day, THEN 5 tablets (50 mg total) daily for 1 day, THEN 4 tablets (40 mg total) daily for 1 day, THEN 3 tablets (30 mg total) daily for 1 day, THEN 2 tablets (20 mg total) daily for 1 day, THEN 1 tablet (10 mg total) daily for 1 day. 21 tablet Dulcie Gammon R, NP   benzonatate  (TESSALON ) 100 MG capsule  (Status: Discontinued) Take 1 capsule (100 mg total) by mouth every 8 (eight) hours. 21 capsule Azai Gaffin R, NP   promethazine -dextromethorphan  (PROMETHAZINE -DM) 6.25-15 MG/5ML syrup  (Status: Discontinued) Take 5 mLs by mouth at bedtime as needed. 118 mL Kiowa Hollar R, NP   benzonatate  (TESSALON ) 100 MG capsule Take 1 capsule (100 mg total) by mouth every 8 (eight) hours. 21 capsule Shakea Isip R, NP   predniSONE  (STERAPRED UNI-PAK 21 TAB) 10 MG (21) TBPK tablet Take 6 tablets (60 mg total) by mouth daily for 1 day, THEN 5 tablets (50 mg total) daily for 1 day, THEN 4 tablets (40 mg total) daily for 1 day, THEN 3 tablets (30 mg total) daily for 1 day, THEN 2 tablets (20 mg total) daily for 1 day, THEN 1 tablet (10 mg total) daily for 1 day. 21 tablet Adyn Serna R, NP   promethazine -dextromethorphan  (PROMETHAZINE -DM) 6.25-15 MG/5ML syrup Take 5 mLs by mouth at bedtime as needed. 118 mL Da Authement, Shelba SAUNDERS, NP      I have reviewed the PDMP during this encounter.   Teresa Shelba SAUNDERS, NP 02/14/24 1109

## 2024-02-25 ENCOUNTER — Other Ambulatory Visit (HOSPITAL_COMMUNITY): Payer: Self-pay

## 2024-02-25 ENCOUNTER — Encounter: Payer: Self-pay | Admitting: Family Medicine

## 2024-02-25 ENCOUNTER — Ambulatory Visit: Admitting: Family Medicine

## 2024-02-25 VITALS — BP 113/79 | HR 81 | Temp 97.9°F | Ht 70.0 in | Wt 258.0 lb

## 2024-02-25 DIAGNOSIS — F988 Other specified behavioral and emotional disorders with onset usually occurring in childhood and adolescence: Secondary | ICD-10-CM

## 2024-02-25 DIAGNOSIS — R109 Unspecified abdominal pain: Secondary | ICD-10-CM | POA: Diagnosis not present

## 2024-02-25 MED ORDER — METHYLPHENIDATE HCL ER (OSM) 27 MG PO TBCR
27.0000 mg | EXTENDED_RELEASE_TABLET | ORAL | 0 refills | Status: AC
  Filled 2024-02-25 – 2024-03-15 (×2): qty 30, 30d supply, fill #0

## 2024-02-25 MED ORDER — METHYLPHENIDATE HCL ER (OSM) 27 MG PO TBCR
27.0000 mg | EXTENDED_RELEASE_TABLET | ORAL | 0 refills | Status: DC
  Filled 2024-02-25 – 2024-04-13 (×2): qty 30, 30d supply, fill #0

## 2024-02-25 MED ORDER — FLUTICASONE PROPIONATE HFA 110 MCG/ACT IN AERO
INHALATION_SPRAY | RESPIRATORY_TRACT | 12 refills | Status: DC
  Filled 2024-02-25: qty 12, 30d supply, fill #0

## 2024-02-25 MED ORDER — METHYLPHENIDATE HCL ER (OSM) 27 MG PO TBCR
27.0000 mg | EXTENDED_RELEASE_TABLET | ORAL | 0 refills | Status: DC
Start: 2024-02-25 — End: 2024-06-21
  Filled 2024-02-25 – 2024-05-12 (×2): qty 30, 30d supply, fill #0

## 2024-02-25 NOTE — Progress Notes (Signed)
   Subjective:    Patient ID: Cindy Hamilton, female    DOB: 1985-12-26, 38 y.o.   MRN: [id number]  HPI This patient has adult ADD. Takes medication responsibly. Medication does help the patient focus in be more functional. Patient relates that they are or not abusing the medication or misusing the medication. The patient understands that if they're having any negative side effects such as elevated high blood pressure severe headaches they would need stop the medication follow-up immediately. They also understand that the prescriptions are to last for 3 months then the patient will need to follow-up before having further prescriptions.  Patient compliance good compliance  Does medication help patient function /attention better helps her stay more focused  Side effects denies side effects  Patient with right side abdominal pain is in the right mid abdomen.  Feels like an ache or discomfort.  Is been going on for couple months.  Not always present but they are fairly frequent.  No high fever or chills with it.  Does have some mucousy stools.  No bloody stools.  Energy level doing okay.  No significant weight change or appetite change.  Denies seeing blood in the stool.   Review of Systems     Objective:   Physical Exam  General-in no acute distress Eyes-no discharge Lungs-respiratory rate normal, CTA CV-no murmurs,RRR Extremities skin warm dry no edema Neuro grossly normal Behavior normal, alert Abdomen is soft no guarding or rebound subjective discomfort right mid abdomen      Assessment & Plan:  Right mid abdomen abdominal discomfort-given that this been going on for couple months now along with occasional stool issue this needs further workup.  Lab work will begin first.  Then we will progress toward CT scan and possible colonoscopy as well.  Will be connecting with gastroenterology to get their input as well  ADD-send in 3 prescriptions she is doing well on the medicine it  does help her stay focused and attentive

## 2024-02-26 ENCOUNTER — Other Ambulatory Visit: Payer: Self-pay

## 2024-02-26 ENCOUNTER — Other Ambulatory Visit (HOSPITAL_COMMUNITY): Payer: Self-pay

## 2024-02-26 DIAGNOSIS — R109 Unspecified abdominal pain: Secondary | ICD-10-CM | POA: Diagnosis not present

## 2024-02-27 ENCOUNTER — Ambulatory Visit: Payer: Self-pay | Admitting: Family Medicine

## 2024-03-01 LAB — CBC
Hematocrit: 40.2 % (ref 34.0–46.6)
Hemoglobin: 12.9 g/dL (ref 11.1–15.9)
MCH: 27.9 pg (ref 26.6–33.0)
MCHC: 32.1 g/dL (ref 31.5–35.7)
MCV: 87 fL (ref 79–97)
Platelets: 319 x10E3/uL (ref 150–450)
RBC: 4.63 x10E6/uL (ref 3.77–5.28)
RDW: 13.6 % (ref 11.7–15.4)
WBC: 6.5 x10E3/uL (ref 3.4–10.8)

## 2024-03-01 LAB — COMPREHENSIVE METABOLIC PANEL WITH GFR
ALT: 21 IU/L (ref 0–32)
AST: 17 IU/L (ref 0–40)
Albumin: 4.2 g/dL (ref 3.9–4.9)
Alkaline Phosphatase: 89 IU/L (ref 44–121)
BUN/Creatinine Ratio: 11 (ref 9–23)
BUN: 7 mg/dL (ref 6–20)
Bilirubin Total: 0.5 mg/dL (ref 0.0–1.2)
CO2: 20 mmol/L (ref 20–29)
Calcium: 9.4 mg/dL (ref 8.7–10.2)
Chloride: 103 mmol/L (ref 96–106)
Creatinine, Ser: 0.65 mg/dL (ref 0.57–1.00)
Globulin, Total: 2.7 g/dL (ref 1.5–4.5)
Glucose: 85 mg/dL (ref 70–99)
Potassium: 4.4 mmol/L (ref 3.5–5.2)
Sodium: 140 mmol/L (ref 134–144)
Total Protein: 6.9 g/dL (ref 6.0–8.5)
eGFR: 118 mL/min/1.73 (ref >59)

## 2024-03-01 LAB — TISSUE TRANSGLUTAMINASE ABS,IGG,IGA
Tissue Transglut Ab: 6 U/mL — ABNORMAL HIGH (ref 0–5)
Transglutaminase IgA: 7 U/mL — ABNORMAL HIGH (ref 0–3)

## 2024-03-01 LAB — C-REACTIVE PROTEIN: CRP: 15 mg/L — ABNORMAL HIGH (ref 0–10)

## 2024-03-01 LAB — LIPASE: Lipase: 35 U/L (ref 14–72)

## 2024-03-01 LAB — SEDIMENTATION RATE: Sed Rate: 6 mm/h (ref 0–32)

## 2024-03-02 ENCOUNTER — Encounter: Payer: Self-pay | Admitting: Family Medicine

## 2024-03-02 ENCOUNTER — Telehealth: Payer: Self-pay | Admitting: Family Medicine

## 2024-03-02 ENCOUNTER — Other Ambulatory Visit: Payer: Self-pay

## 2024-03-02 DIAGNOSIS — R109 Unspecified abdominal pain: Secondary | ICD-10-CM

## 2024-03-02 NOTE — Telephone Encounter (Signed)
 Nurses Please put in consultation for [facility] gastroenterology Therisa Stager, NP  Reason abdominal discomfort

## 2024-03-15 ENCOUNTER — Other Ambulatory Visit: Payer: Self-pay

## 2024-03-15 ENCOUNTER — Other Ambulatory Visit (HOSPITAL_COMMUNITY): Payer: Self-pay

## 2024-03-16 ENCOUNTER — Other Ambulatory Visit: Payer: Self-pay | Admitting: Oncology

## 2024-03-16 ENCOUNTER — Other Ambulatory Visit: Payer: Self-pay

## 2024-03-16 DIAGNOSIS — G4733 Obstructive sleep apnea (adult) (pediatric): Secondary | ICD-10-CM | POA: Diagnosis not present

## 2024-03-16 MED ORDER — AMOXICILLIN-POT CLAVULANATE 875-125 MG PO TABS: 1.0000 | ORAL_TABLET | Freq: Two times a day (BID) | ORAL | 0 refills | Status: DC

## 2024-03-17 ENCOUNTER — Other Ambulatory Visit: Payer: Self-pay | Admitting: Oncology

## 2024-03-17 MED ORDER — FLUCONAZOLE 150 MG PO TABS: 150.0000 mg | ORAL_TABLET | Freq: Every day | ORAL | 0 refills | Status: DC

## 2024-03-26 ENCOUNTER — Other Ambulatory Visit (HOSPITAL_COMMUNITY): Payer: Self-pay

## 2024-03-26 ENCOUNTER — Encounter: Payer: Self-pay | Admitting: Nurse Practitioner

## 2024-03-26 ENCOUNTER — Ambulatory Visit: Admitting: Nurse Practitioner

## 2024-03-26 ENCOUNTER — Other Ambulatory Visit: Payer: Self-pay

## 2024-03-26 VITALS — BP 112/74 | HR 71 | Temp 97.3°F | Ht 70.0 in | Wt 259.0 lb

## 2024-03-26 DIAGNOSIS — Z01411 Encounter for gynecological examination (general) (routine) with abnormal findings: Secondary | ICD-10-CM | POA: Diagnosis not present

## 2024-03-26 DIAGNOSIS — Z01419 Encounter for gynecological examination (general) (routine) without abnormal findings: Secondary | ICD-10-CM

## 2024-03-26 DIAGNOSIS — J300 Vasomotor rhinitis: Secondary | ICD-10-CM | POA: Diagnosis not present

## 2024-03-26 MED ORDER — FLUTICASONE PROPIONATE 50 MCG/ACT NA SUSP
2.0000 | Freq: Every day | NASAL | 5 refills | Status: AC
  Filled 2024-03-26: qty 16, 30d supply, fill #0

## 2024-03-26 NOTE — Progress Notes (Signed)
 Annual Wellness Visit     Patient: Cindy Hamilton, Female    DOB: 12/04/1985, 38 y.o.   MRN: [id number]  Subjective  No chief complaint on file.   Cindy Hamilton is a 38 y.o. female who presents today for her Annual Wellness Visit. She reports consuming a general diet that lacks fruits and veggies and consist of eating out more than she would like. Exercise is limited by recent sickness however she is steadily increasing back up to her usual 30 minutes 3xs a week.. She generally feels well. She reports sleeping well. She does not have additional problems to discuss today.   HPI  Dental: No current dental problems and Last dental visit: 2025   Social History   Tobacco Use   Smoking status: Never   Smokeless tobacco: Never  Vaping Use   Vaping status: Never Used  Substance Use Topics   Alcohol use: Never   Drug use: Never    Patient reports being in a monogamous relationship with her husband. They are interested in foster care due to her husbands infertility due to genetics. In addition, she reports previous use of lo loestrin birth control that caused her headaches. She reports a satisfactory sex life.  Patient reports a regular, heavy menstrual cycle with the use of 5/6 ultra pads daily. She states that her cycles seem to be getting lighter and are not a concern right now.     Medications: Outpatient Medications Prior to Visit  Medication Sig   albuterol  (VENTOLIN  HFA) 108 (90 Base) MCG/ACT inhaler Inhale 2 puffs into the lungs every 6 (six) hours as needed for wheezing or shortness of breath.   amoxicillin -clavulanate (AUGMENTIN ) 875-125 MG tablet Take 1 tablet by mouth 2 (two) times daily.   benzonatate  (TESSALON ) 100 MG capsule Take 1 capsule (100 mg total) by mouth every 8 (eight) hours.   cholecalciferol (VITAMIN D3) 25 MCG (1000 UNIT) tablet Take 1,000 Units by mouth daily.   famotidine  (PEPCID ) 20 MG tablet Take 1 tablet (20 mg total) by mouth 2 (two) times  daily. (Patient taking differently: Take 20 mg by mouth as directed. As needed at bedtime.)   ferrous sulfate  325 (65 FE) MG EC tablet Take 1 tablet (325 mg total) by mouth daily.   fluconazole  (DIFLUCAN ) 150 MG tablet Take 1 tablet (150 mg total) by mouth daily.   FLUoxetine  (PROZAC ) 10 MG capsule Take 1 capsule (10 mg total) by mouth daily.   fluticasone  (FLOVENT  HFA) 110 MCG/ACT inhaler Use 2 puffs twice a day as directed to help control reactive airway   methylphenidate  (CONCERTA ) 27 MG PO CR tablet Take 1 tablet (27 mg total) by mouth every morning.   methylphenidate  (CONCERTA ) 27 MG PO CR tablet Take 1 tablet (27 mg total) by mouth every morning.   methylphenidate  (CONCERTA ) 27 MG PO CR tablet Take 1 tablet (27 mg total) by mouth every morning.   methylphenidate  27 MG PO TB24 Take 1 tablet (27 mg total) by mouth in the morning as directed for ADD   promethazine -dextromethorphan  (PROMETHAZINE -DM) 6.25-15 MG/5ML syrup Take 5 mLs by mouth at bedtime as needed.   valACYclovir  (VALTREX ) 1000 MG tablet Take 2 tablets by mouth at first sign of a cold sore. Repeat in 12 hours. Use as needed as directed   No facility-administered medications prior to visit.    Allergies  Allergen Reactions   Zinc Dermatitis      Review of Systems  HENT:  Negative for sore throat.  Bilateral muffled ear sounds; no difficulty swallowing.  Respiratory:  Negative for cough, shortness of breath and wheezing.   Cardiovascular:  Negative for chest pain.  Gastrointestinal:  Positive for constipation and diarrhea. Negative for blood in stool.       Bowels alternative between constipation and diarrhea RLQ pain has resolved.  Genitourinary:  Negative for dysuria, frequency and urgency.       Stress incontinence with prolonged cough due to illness  Psychiatric/Behavioral:  The patient is not nervous/anxious.        Stopped Prozac  3 months ago and stated that she feels well without it        03/26/2024     9:13 AM  Depression screen PHQ 2/9  Decreased Interest 1  Down, Depressed, Hopeless 0  PHQ - 2 Score 1  Altered sleeping 0  Tired, decreased energy 0  Change in appetite 1  Feeling bad or failure about yourself  0  Trouble concentrating 1  Moving slowly or fidgety/restless 0  Suicidal thoughts 0  PHQ-9 Score 3  Difficult doing work/chores Not difficult at all      03/26/2024    9:13 AM 02/25/2024    4:42 PM 10/22/2023    4:22 PM 06/18/2023    4:04 PM  GAD 7 : Generalized Anxiety Score  Nervous, Anxious, on Edge 0 1 1 0  Control/stop worrying 0 0 0 0  Worry too much - different things 0 0 0 1  Trouble relaxing 0 0 0 0  Restless 0 0 0 0  Easily annoyed or irritable 1 1 1 1   Afraid - awful might happen 0 0 0 0  Total GAD 7 Score 1 2 2 2   Anxiety Difficulty Not difficult at all  Somewhat difficult Not difficult at all         Objective   Physical Exam Constitutional:      General: She is not in acute distress.    Appearance: Normal appearance. She is well-developed.  HENT:     Ears:     Comments: Bilateral retracted TM    Mouth/Throat:     Mouth: Mucous membranes are moist.     Comments: Pharynx minimally injected with cloudy PND Neck:     Thyroid : No thyromegaly.     Trachea: No tracheal deviation.     Comments: Thyroid  non tender to palpation. No mass or goiter noted.  Cardiovascular:     Rate and Rhythm: Normal rate and regular rhythm.     Heart sounds: Normal heart sounds. No murmur heard. Pulmonary:     Effort: Pulmonary effort is normal.     Breath sounds: Normal breath sounds.  Chest:  Breasts:    Right: No swelling, inverted nipple, mass, skin change or tenderness.     Left: No swelling, inverted nipple, mass, skin change or tenderness.  Abdominal:     General: Abdomen is flat. There is no distension.     Palpations: Abdomen is soft.     Tenderness: There is no abdominal tenderness.  Musculoskeletal:     Cervical back: Normal range of motion and  neck supple.  Lymphadenopathy:     Cervical: No cervical adenopathy.     Upper Body:     Right upper body: No supraclavicular, axillary or pectoral adenopathy.     Left upper body: No supraclavicular, axillary or pectoral adenopathy.  Skin:    General: Skin is warm and dry.     Findings: No rash.  Neurological:  Mental Status: She is alert and oriented to person, place, and time.  Psychiatric:        Mood and Affect: Mood normal.        Behavior: Behavior normal.        Thought Content: Thought content normal.        Judgment: Judgment normal.   Last PAP 01/15/22 was normal.      Today's Vitals   03/26/24 0910  BP: 112/74  Pulse: 71  Temp: (!) 97.3 F (36.3 C)  SpO2: 99%  Weight: 259 lb (117.5 kg)  Height: 5' 10 (1.778 m)   Body mass index is 37.16 kg/m.    Assessment & Plan  Problem List Items Addressed This Visit       Respiratory   Vasomotor rhinitis   Other Visit Diagnoses       Well woman exam    -  Primary         Annual wellness visit done today including the all of the following: Established a written schedule for health screening services Health Risk Assessent Completed and Reviewed Exercise Activities and Dietary recommendations  Discussed health benefits of physical activity, and encouraged her to engage in regular exercise appropriate for her age and condition.  Patient labs were up to date. Patient was encouraged to keep upcoming appointment with GI for previous RLQ pain.  Meds ordered this encounter  Medications   fluticasone  (FLONASE ) 50 MCG/ACT nasal spray    Sig: Place 2 sprays into both nostrils daily as needed for congestion.    Dispense:  16 g    Refill:  5    Supervising Provider:   ALPHONSA GLENDIA LABOR [phone]   Educated on the use of flonase ;  instructed to contact office if symptoms persist or worsen.  Return in about 1 year (around 03/26/2025) for physical.   Danise Calin, RN  I have seen and examined this patient alongside  the NP student. I have reviewed and verified the student note and agree with the assessment and plan.  Elveria Quarry, FNP

## 2024-03-26 NOTE — Progress Notes (Deleted)
 c

## 2024-04-09 ENCOUNTER — Other Ambulatory Visit (HOSPITAL_COMMUNITY): Payer: Self-pay

## 2024-04-13 ENCOUNTER — Other Ambulatory Visit: Payer: Self-pay

## 2024-04-13 ENCOUNTER — Other Ambulatory Visit (HOSPITAL_COMMUNITY): Payer: Self-pay

## 2024-04-14 ENCOUNTER — Encounter: Payer: Self-pay | Admitting: Gastroenterology

## 2024-04-29 ENCOUNTER — Ambulatory Visit (INDEPENDENT_AMBULATORY_CARE_PROVIDER_SITE_OTHER): Admitting: Gastroenterology

## 2024-04-29 ENCOUNTER — Encounter: Payer: Self-pay | Admitting: Gastroenterology

## 2024-04-29 VITALS — BP 127/80 | HR 82 | Temp 98.4°F | Ht 70.0 in | Wt 261.8 lb

## 2024-04-29 DIAGNOSIS — Z9049 Acquired absence of other specified parts of digestive tract: Secondary | ICD-10-CM | POA: Diagnosis not present

## 2024-04-29 DIAGNOSIS — D500 Iron deficiency anemia secondary to blood loss (chronic): Secondary | ICD-10-CM

## 2024-04-29 DIAGNOSIS — K760 Fatty (change of) liver, not elsewhere classified: Secondary | ICD-10-CM

## 2024-04-29 DIAGNOSIS — D509 Iron deficiency anemia, unspecified: Secondary | ICD-10-CM

## 2024-04-29 DIAGNOSIS — R1085 Abdominal pain of multiple sites: Secondary | ICD-10-CM

## 2024-04-29 DIAGNOSIS — R1031 Right lower quadrant pain: Secondary | ICD-10-CM | POA: Insufficient documentation

## 2024-04-29 MED ORDER — AMOXICILLIN-POT CLAVULANATE 875-125 MG PO TABS: 1.0000 | ORAL_TABLET | Freq: Two times a day (BID) | ORAL | 0 refills | Status: AC

## 2024-04-29 MED ORDER — DOXYCYCLINE HYCLATE 100 MG PO TABS: 100.0000 mg | ORAL_TABLET | Freq: Two times a day (BID) | ORAL | 0 refills | Status: AC

## 2024-04-29 NOTE — Progress Notes (Unsigned)
 Gastroenterology Office Note     Primary Care Physician:  Alphonsa Glendia LABOR, MD  Primary Gastroenterologist: Dr. Cindie   Chief Complaint   Chief Complaint  Patient presents with   Abdominal Pain    Was having some right side abdominal pain but has since resolved. PCP still suggested she come see you.     History of Present Illness   Cindy Hamilton is a 38 y.o. female presenting today with a history of intermittent abdominal pain, burping, RUQ abdominal pain, chronic GERD, history of IDA, returning due to abdominal pain. Followed by Hematology due to IDA felt due to menorrhagia.   Prior evaluation in 2023 with US  showing stones and sludge in gallbladder, normal CBD. Hepatic steatosis. She underwent cholecystectomy in 2023 thereafter.   Celiac serologies recently weak positive in July 2025. Sed rate normal at 6, CRP elevated at 15. Transaminases normal with AST 17, ALT 21, Tbili 0.5. Hgb 12.9. she has received IV iron  in the past. Noted hx of menorrhagia.    Had right sided pain for 2 months, RLQ, sometimes felt like it radiated to her back. Happened daily. Not constant. Happened throughout the day. Dull/achy. Up to 4-5 times per day. Sometimes a few days without it. No correlation with eating, bowel habits. No fever or chills. No N/V. Resolved now. No associated urinary symptoms or association with cycle.   Has had ongoing upper respiratory symptoms including sinus drainage, cough, sinus pressure, vocal hoarseness, no fever/chills, no SOB, no chest pain, no improvement with OTC agents for over a week. Has been lingering.   FH colon polyps in brother, 2 polyps.   FIB-4 0.42 points Advanced fibrosis excluded Approximate fibrosis stage: Ishak 0-1 (Sterling et al 2006)   Past Medical History:  Diagnosis Date   ADD (attention deficit disorder) 12/19/2021   Diagnosed by psychology   Anemia    Anxiety    Asthma    Depression    GERD (gastroesophageal reflux disease)  12/24/2021   On famotidine  20 mg   Heart murmur    Herpes labialis without complication 12/24/2021   Uses intermittent valacyclovir    Hx of cold sores 12/24/2021   Uses intermittent valacyclovir    Iron  deficiency anemia, unspecified 12/24/2021   Due to heavy cycles.  Takes ferrous sulfate  325 mg daily   Reactive airway disease 12/19/2021   Worse during seasonal allergy peaks    Past Surgical History:  Procedure Laterality Date   CHOLECYSTECTOMY N/A 05/15/2022   Procedure: LAPAROSCOPIC CHOLECYSTECTOMY;  Surgeon: Kallie Manuelita BROCKS, MD;  Location: [facility];  Service: General;  Laterality: N/A;  pt knows to arrive at 7:00   COLONOSCOPY     remote past in [place] .    Current Outpatient Medications  Medication Sig Dispense Refill   albuterol  (VENTOLIN  HFA) 108 (90 Base) MCG/ACT inhaler Inhale 2 puffs into the lungs every 6 (six) hours as needed for wheezing or shortness of breath. 18 g 0   cholecalciferol (VITAMIN D3) 25 MCG (1000 UNIT) tablet Take 1,000 Units by mouth daily.     famotidine  (PEPCID ) 20 MG tablet Take 1 tablet (20 mg total) by mouth 2 (two) times daily. (Patient taking differently: Take 20 mg by mouth as directed. As needed at bedtime.) 90 tablet 1   ferrous sulfate  325 (65 FE) MG EC tablet Take 1 tablet (325 mg total) by mouth daily. 90 tablet 2   fluticasone  (FLONASE ) 50 MCG/ACT nasal spray Place 2 sprays into both nostrils daily as needed for  congestion. 16 g 5   fluticasone  (FLOVENT  HFA) 110 MCG/ACT inhaler Use 2 puffs twice a day as directed to help control reactive airway 12 g 12   methylphenidate  (CONCERTA ) 27 MG PO CR tablet Take 1 tablet (27 mg total) by mouth every morning. 30 tablet 0   methylphenidate  (CONCERTA ) 27 MG PO CR tablet Take 1 tablet (27 mg total) by mouth every morning. 30 tablet 0   methylphenidate  (CONCERTA ) 27 MG PO CR tablet Take 1 tablet (27 mg total) by mouth every morning. 30 tablet 0   methylphenidate  27 MG PO TB24 Take 1 tablet (27 mg total) by  mouth in the morning as directed for ADD 30 tablet 0   valACYclovir  (VALTREX ) 1000 MG tablet Take 2 tablets by mouth at first sign of a cold sore. Repeat in 12 hours. Use as needed as directed 60 tablet 2   No current facility-administered medications for this visit.    Allergies as of 04/29/2024 - Review Complete 04/29/2024  Allergen Reaction Noted   Zinc Dermatitis 05/13/2022    Family History  Problem Relation Age of Onset   Osteopenia Father    Prostate cancer Father    Sleep apnea Father    Colon polyps Brother        unknown if adenomas   Cancer Paternal Aunt        Ovarian   Cancer Maternal Grandmother        Non-Hodgkin's lymphoma   Diabetes Maternal Grandfather    Heart disease Maternal Grandfather    Prostate cancer Paternal Grandfather    Colon cancer Neg Hx     Social History   Socioeconomic History   Marital status: Married    Spouse name: Not on file   Number of children: Not on file   Years of education: Not on file   Highest education level: Not on file  Occupational History   Not on file  Tobacco Use   Smoking status: Never   Smokeless tobacco: Never  Vaping Use   Vaping status: Never Used  Substance and Sexual Activity   Alcohol use: Never   Drug use: Never   Sexual activity: Yes  Other Topics Concern   Not on file  Social History Narrative   Not on file   Social Drivers of Health   Financial Resource Strain: Not on file  Food Insecurity: No Food Insecurity (10/31/2023)   Hunger Vital Sign    Worried About Running Out of Food in the Last Year: Never true    Ran Out of Food in the Last Year: Never true  Transportation Needs: No Transportation Needs (10/31/2023)   PRAPARE - Administrator, Civil Service (Medical): No    Lack of Transportation (Non-Medical): No  Physical Activity: Not on file  Stress: Not on file  Social Connections: Not on file  Intimate Partner Violence: Not At Risk (10/31/2023)   Humiliation, Afraid, Rape, and  Kick questionnaire    Fear of Current or Ex-Partner: No    Emotionally Abused: No    Physically Abused: No    Sexually Abused: No     Review of Systems   Gen: Denies any fever, chills, fatigue, weight loss, lack of appetite.  CV: Denies chest pain, heart palpitations, peripheral edema, syncope.  Resp: Denies shortness of breath at rest or with exertion. Denies wheezing or cough.  GI: Denies dysphagia or odynophagia. Denies jaundice, hematemesis, fecal incontinence. GU : Denies urinary burning, urinary frequency, urinary hesitancy MS:  Denies joint pain, muscle weakness, cramps, or limitation of movement.  Derm: Denies rash, itching, dry skin Psych: Denies depression, anxiety, memory loss, and confusion Heme: Denies bruising, bleeding, and enlarged lymph nodes.   Physical Exam   BP 127/80 (BP Location: Right Arm, Patient Position: Sitting, Cuff Size: Large)   Pulse 82   Temp 98.4 F (36.9 C) (Oral)   Ht 5' 10 (1.778 m)   Wt 261 lb 12.8 oz (118.8 kg)   LMP  (LMP Unknown)   SpO2 95%   BMI 37.56 kg/m  General:   Alert and oriented. Pleasant and cooperative. Well-nourished and well-developed.  Head:  Normocephalic and atraumatic. Eyes:  Without icterus Abdomen:  +BS, soft, and non-distended. No HSM noted. No guarding or rebound. No masses appreciated. Very mild TTP RLQ Rectal:  Deferred  Msk:  Symmetrical without gross deformities. Normal posture. Extremities:  Without edema. Neurologic:  Alert and  oriented x4;  grossly normal neurologically. Skin:  Intact without significant lesions or rashes. Psych:  Alert and cooperative. Normal mood and affect.   Assessment   Cindy Hamilton is a 38 y.o. female presenting today with a history of  intermittent abdominal pain, burping, RUQ abdominal pain due to biliary etiology s/p cholecystectomy and with improvement, chronic GERD, history of IDA, returning due to abdominal pain now reported as RLQ. Followed by Hematology due to IDA  felt due to menorrhagia. She also was found to have weakly positive celiac serologies in July 2025.  RLQ pain: lasting 2 months without any obvious precipitating  factors, now resolved. Unclear etiology and could be related to GI, GYN, doubt appendix. As this has resolved, will hold off on imaging. However, if any recurrence, needs CT. I have also recommended follow-up with GYN.  Hepatic steatosis: Fib-4 low at 0.42, discussed management. Check ELF. Mediterranean diet recommended.   Weakly positive celiac serologies: recheck this. She has not been avoiding gluten. This could also explain component of IDA although IDA largely r/t menorrhagia likely.   IDA: due to need for prior infusions, recommend colonoscopy/EGD. Will check ifobt. She also noted after visit that her brother has hx of sessile serrated adenomas. I strongly recommend colonoscopy in future: awaiting labs for now and will discuss once resulted.   URI: uncomplicated. Abx sent to pharmacy. If any worsening or no improvement, reach out to PCP.         PLAN    Recheck celiac panel, check ELF Discussed fatty liver management Ifobt Abx sent to pharmacy for URI Recommending colonoscopy/EGD in future in light of IDA and also with FH polyps in brother: she prefers to await labs and hemoccult first.    Therisa MICAEL Stager, PhD, ANP-BC [facility]

## 2024-04-29 NOTE — Patient Instructions (Addendum)
 I have ordered the celiac panel and the ELF test (to help predict fibrosis).  Continue healthy efforts for management of fatty liver as you are doing! The Mediterranean diet has shown to be helpful for this!  If you have any further pain, message me, and we will do a CT.  I have also requested a hemoccult. If positive, I recommend colonoscopy/endoscopy.  I have sent in Augmentin  to your pharmacy! Please follow-up with PCP if symptoms persist.  Further recommendations after the labs are back!  HAPPY BIRTHDAY!!!   I enjoyed seeing you again today! I value our relationship and want to provide genuine, compassionate, and quality care. You may receive a survey regarding your visit with me, and I welcome your feedback! Thanks so much for taking the time to complete this. I look forward to seeing you again.      Therisa MICAEL Stager, PhD, ANP-BC [facility]

## 2024-05-11 ENCOUNTER — Telehealth: Payer: Self-pay

## 2024-05-11 ENCOUNTER — Other Ambulatory Visit (HOSPITAL_COMMUNITY): Payer: Self-pay

## 2024-05-11 NOTE — Telephone Encounter (Signed)
 Medical Evaluation form dropped off to be completed by Dr Glendia form for [facility]

## 2024-05-12 ENCOUNTER — Other Ambulatory Visit (HOSPITAL_COMMUNITY): Payer: Self-pay

## 2024-05-12 ENCOUNTER — Other Ambulatory Visit: Payer: Self-pay

## 2024-05-12 NOTE — Progress Notes (Unsigned)
Patient: Cindy Hamilton Date of Birth: May 01, 1986  Reason for Visit: Follow up History from: Patient Primary Neurologist: Buck  ASSESSMENT AND PLAN 38 y.o. year old female   1.  OSA on CPAP (Had PSG 06/13/2022.  Showed mild/borderline OSA with total AHI of 6/hour, REM AHI of 19.7/hour and O2 nadir of 89%. CPAP setup date 03/13/23.)  Doing great with CPAP!  Will continue nightly usage for minimum 4 hours continue current settings.  Has excellent subjective benefit.  Follow-up in 1 year virtually.  HISTORY OF PRESENT ILLNESS: Today 05/13/24 Here for CPAP revisit.  Usage 67%, greater than 4 hours 60%, 5 to 11 cm water.  AHI 0.5, leak 3.5. uses nasal mask. Had to skip recently due to sinus issues. No longer daytime sleepy during the day, sleeps better with CPAP, no longer snoring. May miss a few days if camping. Otherwise takes when she travels. Very pleased with CPAP. 90 day data shows 72% usage,  AHI 0.5, leak 4.  05/08/23 SS: Had PSG 06/13/2022.  Showed mild/borderline OSA with total AHI of 6/hour, REM AHI of 19.7/hour and O2 nadir of 89%. CPAP setup date 03/13/23. CPAP is helping, more energy during the day.  Download shows excellent compliance, 90% greater than 4 hours. 5-11 centimeters water.  Leak 12.4.  AHI 1.0.  Uses nasal pillow mask, has fullface mask as backup.  There were 3 nights she did not use due to going camping.  On those nights, she felt very tired during the day.  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  She is pleased with CPAP, motivated to continue.  ESS 6.  HISTORY  03/20/22 Dr. Buck: I saw your patient, Cindy Hamilton, upon your kind request in my sleep clinic today for initial consultation of her sleep disorder, in particular, concern for underlying obstructive sleep apnea.  The patient is accompanied by her husband today.  As you know, Ms. Bouldin is a 38 year old female with an underlying medical history of reflux disease, ADD, asthma,  anemia, anxiety, depression, and obesity, who reports snoring and excessive daytime somnolence.  I reviewed your office note from 12/19/2021.  Her weight has been fluctuating.  She has had stress in the recent past but with her husband's medical condition.  She works full-time as a Systems analyst.  She denies night to night nocturia or recurrent morning or nocturnal headaches.  Her father has sleep apnea.  She drinks caffeine in the form of coffee, 1 or 2 cups/day, she is a non-smoker and does not currently consume any alcohol.  She lives with her spouse, they have 1 dog in the household, they do not have a TV in the bedroom.  She goes to bed generally between 10 and 11 and rise time is between 6 and 6:30 AM.  She has a history of sleep talking for her husband.  She did have sleep talking as a child as well.  She had recent testing for attention deficit and was found to have residual adult attention deficit disorder.  She is on long-acting methylphenidate .  Her Epworth sleepiness score despite this is 14 out of 24, fatigue severity score is 25 out of 63.   REVIEW OF SYSTEMS: Out of a complete 14 system review of symptoms, the patient complains only of the following symptoms, and all other reviewed systems are negative.  See HPI  ALLERGIES: Allergies  Allergen Reactions   Zinc Dermatitis    HOME MEDICATIONS: Outpatient Medications Prior to Visit  Medication Sig Dispense Refill   albuterol  (VENTOLIN  HFA) 108 (90 Base) MCG/ACT inhaler Inhale 2 puffs into the lungs every 6 (six) hours as needed for wheezing or shortness of breath. 18 g 0   cholecalciferol (VITAMIN D3) 25 MCG (1000 UNIT) tablet Take 1,000 Units by mouth daily.     famotidine  (PEPCID ) 20 MG tablet Take 1 tablet (20 mg total) by mouth 2 (two) times daily. (Patient taking differently: Take 20 mg by mouth as directed. As needed at bedtime.) 90 tablet 1   ferrous sulfate  325 (65 FE) MG EC tablet Take 1 tablet (325 mg total) by mouth daily. 90  tablet 2   fluticasone  (FLONASE ) 50 MCG/ACT nasal spray Place 2 sprays into both nostrils daily as needed for congestion. 16 g 5   fluticasone  (FLOVENT  HFA) 110 MCG/ACT inhaler Use 2 puffs twice a day as directed to help control reactive airway 12 g 12   methylphenidate  (CONCERTA ) 27 MG PO CR tablet Take 1 tablet (27 mg total) by mouth every morning. 30 tablet 0   methylphenidate  (CONCERTA ) 27 MG PO CR tablet Take 1 tablet (27 mg total) by mouth every morning. 30 tablet 0   methylphenidate  (CONCERTA ) 27 MG PO CR tablet Take 1 tablet (27 mg total) by mouth every morning. 30 tablet 0   methylphenidate  27 MG PO TB24 Take 1 tablet (27 mg total) by mouth in the morning as directed for ADD 30 tablet 0   valACYclovir  (VALTREX ) 1000 MG tablet Take 2 tablets by mouth at first sign of a cold sore. Repeat in 12 hours. Use as needed as directed 60 tablet 2   No facility-administered medications prior to visit.    PAST MEDICAL HISTORY: Past Medical History:  Diagnosis Date   ADD (attention deficit disorder) 12/19/2021   Diagnosed by psychology   Anemia    Anxiety    Asthma    Depression    GERD (gastroesophageal reflux disease) 12/24/2021   On famotidine  20 mg   Heart murmur    Herpes labialis without complication 12/24/2021   Uses intermittent valacyclovir    Hx of cold sores 12/24/2021   Uses intermittent valacyclovir    Iron  deficiency anemia, unspecified 12/24/2021   Due to heavy cycles.  Takes ferrous sulfate  325 mg daily   Reactive airway disease 12/19/2021   Worse during seasonal allergy peaks    PAST SURGICAL HISTORY: Past Surgical History:  Procedure Laterality Date   CHOLECYSTECTOMY N/A 05/15/2022   Procedure: LAPAROSCOPIC CHOLECYSTECTOMY;  Surgeon: Kallie Manuelita BROCKS, MD;  Location: [facility];  Service: General;  Laterality: N/A;  pt knows to arrive at 7:00   COLONOSCOPY     remote past in [place] .    FAMILY HISTORY: Family History  Problem Relation Age of Onset   Osteopenia Father     Prostate cancer Father    Sleep apnea Father    Colon polyps Brother        unknown if adenomas   Cancer Paternal Aunt        Ovarian   Cancer Maternal Grandmother        Non-Hodgkin's lymphoma   Dementia Maternal Grandfather    Diabetes Maternal Grandfather    Heart disease Maternal Grandfather    Parkinsonism Paternal Grandfather    Prostate cancer Paternal Grandfather    Colon cancer Neg Hx     SOCIAL HISTORY: Social History   Socioeconomic History   Marital status: Married    Spouse name: ryan   Number of children: 0  Years of education: Not on file   Highest education level: Master's degree (e.g., MA, MS, MEng, MEd, MSW, MBA)  Occupational History   Not on file  Tobacco Use   Smoking status: Never    Passive exposure: Never   Smokeless tobacco: Never  Vaping Use   Vaping status: Never Used  Substance and Sexual Activity   Alcohol use: Never   Drug use: Never   Sexual activity: Yes    Birth control/protection: None  Other Topics Concern   Not on file  Social History Narrative   Not on file   Social Drivers of Health   Financial Resource Strain: Not on file  Food Insecurity: No Food Insecurity (10/31/2023)   Hunger Vital Sign    Worried About Running Out of Food in the Last Year: Never true    Ran Out of Food in the Last Year: Never true  Transportation Needs: No Transportation Needs (10/31/2023)   PRAPARE - Administrator, Civil Service (Medical): No    Lack of Transportation (Non-Medical): No  Physical Activity: Not on file  Stress: Not on file  Social Connections: Not on file  Intimate Partner Violence: Not At Risk (10/31/2023)   Humiliation, Afraid, Rape, and Kick questionnaire    Fear of Current or Ex-Partner: No    Emotionally Abused: No    Physically Abused: No    Sexually Abused: No   PHYSICAL EXAM  Vitals:   05/13/24 1303  BP: 114/78  Pulse: 73  Resp: 17  SpO2: 98%  Weight: 261 lb 8 oz (118.6 kg)  Height: 5' 10 (1.778 m)     Body mass index is 37.52 kg/m.  Generalized: Well developed, in no acute distress  Neurological examination  Mentation: Alert oriented to time, place, history taking. Follows all commands speech and language fluent Cranial nerve II-XII: Extraocular movements were full, visual field were full on confrontational test.  Motor: Moves all extremities independently Gait and station: Gait is normal.   DIAGNOSTIC DATA (LABS, IMAGING, TESTING) - I reviewed patient records, labs, notes, testing and imaging myself where available.  Lab Results  Component Value Date   WBC 6.5 02/26/2024   HGB 12.9 02/26/2024   HCT 40.2 02/26/2024   MCV 87 02/26/2024   PLT 319 02/26/2024      Component Value Date/Time   NA 140 02/26/2024 1037   K 4.4 02/26/2024 1037   CL 103 02/26/2024 1037   CO2 20 02/26/2024 1037   GLUCOSE 85 02/26/2024 1037   GLUCOSE 95 12/01/2023 1503   BUN 7 02/26/2024 1037   CREATININE 0.65 02/26/2024 1037   CREATININE 0.66 03/11/2022 0730   CALCIUM 9.4 02/26/2024 1037   PROT 6.9 02/26/2024 1037   ALBUMIN 4.2 02/26/2024 1037   AST 17 02/26/2024 1037   ALT 21 02/26/2024 1037   ALKPHOS 89 02/26/2024 1037   BILITOT 0.5 02/26/2024 1037   GFRNONAA >60 12/01/2023 1503   Lab Results  Component Value Date   CHOL 165 10/10/2023   HDL 37 (L) 10/10/2023   LDLCALC 106 (H) 10/10/2023   TRIG 124 10/10/2023   CHOLHDL 4.5 (H) 10/10/2023   Lab Results  Component Value Date   HGBA1C 5.6 10/10/2023   Lab Results  Component Value Date   VITAMINB12 421 12/01/2023   Lab Results  Component Value Date   TSH 1.790 10/19/2021    Lauraine Born, AGNP-C, DNP 05/13/2024, 1:26 PM [facility] [address] [place], [place] [address] [phone])  [phone]  

## 2024-05-13 ENCOUNTER — Encounter: Payer: Self-pay | Admitting: Neurology

## 2024-05-13 ENCOUNTER — Ambulatory Visit: Payer: 59 | Admitting: Neurology

## 2024-05-13 VITALS — BP 114/78 | HR 73 | Resp 17 | Ht 70.0 in | Wt 261.5 lb

## 2024-05-13 DIAGNOSIS — G4730 Sleep apnea, unspecified: Secondary | ICD-10-CM

## 2024-05-13 NOTE — Patient Instructions (Signed)
 Great to see you today! Continue CPAP usage minimum 4 hours nightly Continue current settings Continue to replace supplies routinely through DME Follow-up in 1 year or sooner if needed. Thanks!!

## 2024-05-14 ENCOUNTER — Encounter: Payer: Self-pay | Admitting: Family Medicine

## 2024-05-14 NOTE — Telephone Encounter (Signed)
 Completed may pick up

## 2024-05-25 ENCOUNTER — Inpatient Hospital Stay: Attending: Oncology

## 2024-05-25 DIAGNOSIS — D500 Iron deficiency anemia secondary to blood loss (chronic): Secondary | ICD-10-CM

## 2024-05-25 DIAGNOSIS — D509 Iron deficiency anemia, unspecified: Secondary | ICD-10-CM | POA: Insufficient documentation

## 2024-05-25 LAB — CBC WITH DIFFERENTIAL/PLATELET
Abs Immature Granulocytes: 0.01 K/uL (ref 0.00–0.07)
Basophils Absolute: 0 K/uL (ref 0.0–0.1)
Basophils Relative: 1 %
Eosinophils Absolute: 0.1 K/uL (ref 0.0–0.5)
Eosinophils Relative: 3 %
HCT: 37.2 % (ref 36.0–46.0)
Hemoglobin: 12.5 g/dL (ref 12.0–15.0)
Immature Granulocytes: 0 %
Lymphocytes Relative: 34 %
Lymphs Abs: 1.8 K/uL (ref 0.7–4.0)
MCH: 27.9 pg (ref 26.0–34.0)
MCHC: 33.6 g/dL (ref 30.0–36.0)
MCV: 83 fL (ref 80.0–100.0)
Monocytes Absolute: 0.4 K/uL (ref 0.1–1.0)
Monocytes Relative: 8 %
Neutro Abs: 2.8 K/uL (ref 1.7–7.7)
Neutrophils Relative %: 54 %
Platelets: 299 K/uL (ref 150–400)
RBC: 4.48 MIL/uL (ref 3.87–5.11)
RDW: 13.2 % (ref 11.5–15.5)
WBC: 5.1 K/uL (ref 4.0–10.5)
nRBC: 0 % (ref 0.0–0.2)

## 2024-05-25 LAB — FOLATE: Folate: 7.2 ng/mL (ref >5.9)

## 2024-05-25 LAB — COMPREHENSIVE METABOLIC PANEL WITH GFR
ALT: 21 U/L (ref 0–44)
AST: 21 U/L (ref 15–41)
Albumin: 4.6 g/dL (ref 3.5–5.0)
Alkaline Phosphatase: 90 U/L (ref 38–126)
Anion gap: 13 (ref 5–15)
BUN: 10 mg/dL (ref 6–20)
CO2: 23 mmol/L (ref 22–32)
Calcium: 9.6 mg/dL (ref 8.9–10.3)
Chloride: 103 mmol/L (ref 98–111)
Creatinine, Ser: 0.63 mg/dL (ref 0.44–1.00)
GFR, Estimated: >60 mL/min (ref >60)
Glucose, Bld: 78 mg/dL (ref 70–99)
Potassium: 3.9 mmol/L (ref 3.5–5.1)
Sodium: 139 mmol/L (ref 135–145)
Total Bilirubin: 0.3 mg/dL (ref 0.0–1.2)
Total Protein: 7.7 g/dL (ref 6.5–8.1)

## 2024-05-25 LAB — IRON AND TIBC
Iron: 60 ug/dL (ref 28–170)
Saturation Ratios: 18 % (ref 10.4–31.8)
TIBC: 342 ug/dL (ref 250–450)
UIBC: 281 ug/dL

## 2024-05-25 LAB — VITAMIN B12: Vitamin B-12: 771 pg/mL (ref 180–914)

## 2024-05-25 LAB — FERRITIN: Ferritin: 171 ng/mL (ref 11–307)

## 2024-05-31 ENCOUNTER — Inpatient Hospital Stay

## 2024-06-07 ENCOUNTER — Inpatient Hospital Stay: Admitting: Oncology

## 2024-06-15 DIAGNOSIS — G4733 Obstructive sleep apnea (adult) (pediatric): Secondary | ICD-10-CM | POA: Diagnosis not present

## 2024-06-16 ENCOUNTER — Encounter: Payer: Self-pay | Admitting: Family Medicine

## 2024-06-17 ENCOUNTER — Other Ambulatory Visit: Payer: Self-pay

## 2024-06-17 ENCOUNTER — Telehealth: Payer: Self-pay | Admitting: Family Medicine

## 2024-06-17 DIAGNOSIS — J988 Other specified respiratory disorders: Secondary | ICD-10-CM

## 2024-06-17 NOTE — Telephone Encounter (Signed)
 Nurses Please put in consult for allergy immunology Due to frequent respiratory infections  Patient prefers allergy immunology in [place] if it is on her insurance if it is not on [employer] she will go with allergy immunology in [place]  Thank you-Dr. Ivar will send her a MyChart message letting her know this has been initiated

## 2024-06-21 ENCOUNTER — Other Ambulatory Visit: Payer: Self-pay | Admitting: Family Medicine

## 2024-06-21 ENCOUNTER — Other Ambulatory Visit (HOSPITAL_COMMUNITY): Payer: Self-pay

## 2024-06-21 ENCOUNTER — Encounter: Payer: Self-pay | Admitting: Internal Medicine

## 2024-06-22 ENCOUNTER — Other Ambulatory Visit (HOSPITAL_COMMUNITY): Payer: Self-pay

## 2024-06-22 ENCOUNTER — Other Ambulatory Visit: Payer: Self-pay

## 2024-06-22 MED ORDER — METHYLPHENIDATE HCL ER (OSM) 27 MG PO TBCR
27.0000 mg | EXTENDED_RELEASE_TABLET | ORAL | 0 refills | Status: AC
  Filled 2024-06-22: qty 30, 30d supply, fill #0

## 2024-07-16 ENCOUNTER — Other Ambulatory Visit: Payer: Self-pay

## 2024-07-16 ENCOUNTER — Other Ambulatory Visit (HOSPITAL_COMMUNITY): Payer: Self-pay

## 2024-07-16 ENCOUNTER — Encounter: Payer: Self-pay | Admitting: Internal Medicine

## 2024-07-16 ENCOUNTER — Ambulatory Visit: Admitting: Internal Medicine

## 2024-07-16 VITALS — BP 128/82 | HR 91 | Temp 98.8°F | Ht 68.0 in | Wt 262.5 lb

## 2024-07-16 DIAGNOSIS — B999 Unspecified infectious disease: Secondary | ICD-10-CM | POA: Diagnosis not present

## 2024-07-16 DIAGNOSIS — J4520 Mild intermittent asthma, uncomplicated: Secondary | ICD-10-CM | POA: Diagnosis not present

## 2024-07-16 DIAGNOSIS — J3089 Other allergic rhinitis: Secondary | ICD-10-CM

## 2024-07-16 MED ORDER — AIRSUPRA 90-80 MCG/ACT IN AERO
2.0000 | INHALATION_SPRAY | Freq: Four times a day (QID) | RESPIRATORY_TRACT | 1 refills | Status: AC | PRN
  Filled 2024-07-16: qty 10.7, 30d supply, fill #0

## 2024-07-16 NOTE — Progress Notes (Signed)
 "  NEW PATIENT  Date of Service/Encounter:  07/16/2024  Consult requested by: Alphonsa Glendia LABOR, MD   Subjective:   Cindy Hamilton (DOB: Jun 05, 1986) is a 38 y.o. female who presents to the clinic on 07/16/2024 with a chief complaint of Frequent Infections (Upper Respiratory Infection 06/11/2024./Cough, sinus issues and fever. 6 febrile infections this year) .    History obtained from: chart review and patient.   Asthma:  Diagnosed in 7th grade.  Mostly with exercise induced asthma at the time. Now notes it does well overall but flare ups with illness or cold weather. Does note prolonged coughing and chest tightness/dyspnea with illness.   Using rescue inhaler: 2x/week or less Limitations to daily activity: none 1 ED visits/UC visits and 1 oral steroids in the past year 0 number of lifetime hospitalizations, 0 number of lifetime intubations.  Identified Triggers: exercise, respiratory illness, and cold air Prior PFTs or spirometry: none  Previously used therapies: Flovent  causing hoarseness/vocal cord irritation.  Current regimen:  Maintenance: none Rescue: Albuterol  2 puffs q4-6 hrs PRN  Rhinitis:  Started since she moved to [place] about 10 years ago.   Symptoms include: nasal congestion, rhinorrhea, post nasal drainage, and sneezing  Occurs seasonally-Spring/Fall  Potential triggers: not sure   Treatments tried:  Zyrtec/Flonase  PRN  Previous allergy testing: no History of sinus surgery: no Nonallergic triggers: none    Recurrent Infections:  Age of onset: started in 2025  Types of infections:  2 febrile URI in Spring- no antibiotics January- viral brochitis- cough/low O2 sats in 90s/fever August 2025 - cough, URI, ear infection, required Augmentin  Sept/Oct- fever, URI; required doxycycline  Nov- cough, sinus issues, fever   No GI/skin infections/deep organ infections/brain infection No prolonged IV antibiotics Husband is a transplant patient and on immunosuppressants  but she is sicker than him.   Antibiotic use: twice in 2025  Hospitalization or ER visits: once after cholecystectomy; diagnosed with pneumonia and stayed in hospital for a day for observation and given 1 day of Zosyn  and then discharged home with Augmentin .   Testing performed: none    Reviewed:  05/13/2024: seen by Neurology [place] NP for OSA, discussed use of CPAP nightly.   03/26/2024: seen by Mauro NP Fam [facility] for annual exam, has hx of vasomotor rhinitis.   02/14/2024: seen by urgent care for nasal congestion, sinus pressure, cough, SOB, cough. No wheezing on exam.  Flu negative. Discussed likely URI, started on prednisone , tessalon , promethazine -DM.   Past Medical History: Past Medical History:  Diagnosis Date   ADD (attention deficit disorder) 12/19/2021   Diagnosed by psychology   Anemia    Anxiety    Asthma    Depression    GERD (gastroesophageal reflux disease) 12/24/2021   On famotidine  20 mg   Heart murmur    Herpes labialis without complication 12/24/2021   Uses intermittent valacyclovir    Hx of cold sores 12/24/2021   Uses intermittent valacyclovir    Iron  deficiency anemia, unspecified 12/24/2021   Due to heavy cycles.  Takes ferrous sulfate  325 mg daily   Reactive airway disease 12/19/2021   Worse during seasonal allergy peaks   Recurrent upper respiratory infection (URI)    Past Surgical History: Past Surgical History:  Procedure Laterality Date   CHOLECYSTECTOMY N/A 05/15/2022   Procedure: LAPAROSCOPIC CHOLECYSTECTOMY;  Surgeon: Kallie Manuelita BROCKS, MD;  Location: [facility];  Service: General;  Laterality: N/A;  pt knows to arrive at 7:00   COLONOSCOPY     remote past in [place] .  Family History: Family History  Problem Relation Age of Onset   Osteopenia Father    Prostate cancer Father    Sleep apnea Father    Colon polyps Brother        unknown if adenomas   Cancer Paternal Aunt        Ovarian   Cancer Maternal Grandmother        Non-Hodgkin's  lymphoma   Dementia Maternal Grandfather    Diabetes Maternal Grandfather    Heart disease Maternal Grandfather    Parkinsonism Paternal Grandfather    Prostate cancer Paternal Grandfather    Colon cancer Neg Hx     Social History:  Flooring in bedroom: wood Pets: dog  Tobacco use/exposure: none  Job: hematology PA  Medication List:  Allergies as of 07/16/2024       Reactions   Zinc Dermatitis        Medication List        Accurate as of July 16, 2024  4:11 PM. If you have any questions, ask your nurse or doctor.          albuterol  108 (90 Base) MCG/ACT inhaler Commonly known as: VENTOLIN  HFA Inhale 2 puffs into the lungs every 6 (six) hours as needed for wheezing or shortness of breath.   cholecalciferol 25 MCG (1000 UNIT) tablet Commonly known as: VITAMIN D3 Take 1,000 Units by mouth daily.   famotidine  20 MG tablet Commonly known as: Pepcid  Take 1 tablet (20 mg total) by mouth 2 (two) times daily. What changed:  when to take this additional instructions   ferrous sulfate  325 (65 FE) MG EC tablet Take 1 tablet (325 mg total) by mouth daily.   fluticasone  110 MCG/ACT inhaler Commonly known as: Flovent  HFA Use 2 puffs twice a day as directed to help control reactive airway   fluticasone  50 MCG/ACT nasal spray Commonly known as: FLONASE  Place 2 sprays into both nostrils daily as needed for congestion.   methylphenidate  27 MG Tb24 Commonly known as: CONCERTA  Take 1 tablet (27 mg total) by mouth in the morning as directed for ADD   methylphenidate  27 MG CR tablet Commonly known as: Concerta  Take 1 tablet (27 mg total) by mouth every morning.   methylphenidate  27 MG CR tablet Commonly known as: Concerta  Take 1 tablet (27 mg total) by mouth every morning.   methylphenidate  27 MG CR tablet Commonly known as: Concerta  Take 1 tablet (27 mg total) by mouth every morning.   valACYclovir  1000 MG tablet Commonly known as: VALTREX  Take 2 tablets by  mouth at first sign of a cold sore. Repeat in 12 hours. Use as needed as directed         REVIEW OF SYSTEMS: Pertinent positives and negatives discussed in HPI.   Objective:   Physical Exam: BP 128/82 (BP Location: Right Arm, Patient Position: Sitting, Cuff Size: Normal)   Pulse 91   Temp 98.8 F (37.1 C) (Temporal)   Ht 5' 8 (1.727 m)   Wt 262 lb 8 oz (119.1 kg)   SpO2 98%   BMI 39.91 kg/m  Body mass index is 39.91 kg/m. GEN: alert, well developed HEENT: clear conjunctiva, nose with + mild inferior turbinate hypertrophy, pink nasal mucosa, slight clear rhinorrhea, no cobblestoning HEART: regular rate and rhythm, no murmur LUNGS: clear to auscultation bilaterally, no coughing, unlabored respiration ABDOMEN: soft, non distended  SKIN: no rashes or lesions  Spirometry:  Tracings reviewed. Her effort: It was hard to get consistent efforts and  there is a question as to whether this reflects a maximal maneuver. FVC: 3.56L, 82% predicted FEV1: 2.37L, 67% predicted FEV1/FVC ratio: 67% Interpretation: Spirometry consistent with mild obstructive disease.  Please see scanned spirometry results for details.   Assessment:   1. Recurrent infections   2. Other allergic rhinitis   3. Mild intermittent asthma without complication     Plan/Recommendations:  Mild Intermittent Asthma: - Mostly illness/cold weather related symptoms.  Discussed switch Albuterol  to Airsupra .  Some obstruction on spirometry but suboptimal effort.   - Rescue inhaler: Airsupra  2 puffs every 4-6 hours as needed for respiratory symptoms of cough, shortness of breath, or wheezing Asthma control goals:  Full participation in all desired activities (may need albuterol  before activity) Albuterol  use two times or less a week on average (not counting use with activity) Cough interfering with sleep two times or less a month Oral steroids no more than once a year No hospitalizations  Other Allergic  Rhinitis: - Due to turbinate hypertrophy, seasonal symptoms, recurrent sinus infections and unresponsive to over the counter meds, will perform skin testing to identify aeroallergen triggers.   - Use nasal saline rinses before nose sprays such as with Neilmed Sinus Rinse.  Use distilled water.   - Use Flonase  1-2 sprays each nostril daily. Aim upward and outward. - Use Zyrtec 10 mg daily as needed for runny nose, sneezing, itchy watery eyes.   Recurrent Infections - Hx of recurrent respiratory illness.  - Will do immune screen with Ig levels and protein/carbohydrate vaccine titers.    Follow up: 1/16 at 830 for skin testing 1-55   Arleta Blanch, MD [facility]        "

## 2024-07-16 NOTE — Patient Instructions (Addendum)
 Mild Intermittent Asthma: - Rescue inhaler: Airsupra  2 puffs every 4-6 hours as needed for respiratory symptoms of cough, shortness of breath, or wheezing Asthma control goals:  Full participation in all desired activities (may need albuterol  before activity) Albuterol  use two times or less a week on average (not counting use with activity) Cough interfering with sleep two times or less a month Oral steroids no more than once a year No hospitalizations  Other Allergic Rhinitis: - Use nasal saline rinses before nose sprays such as with Neilmed Sinus Rinse.  Use distilled water.   - Use Flonase  1-2 sprays each nostril daily. Aim upward and outward. - Use Zyrtec 10 mg daily as needed for runny nose, sneezing, itchy watery eyes.   Recurrent Infections - Will do immune screen   Hold all anti-histamines (Xyzal, Allegra, Zyrtec, Claritin, Benadryl, Pepcid ) 3 days prior to next visit.  Follow up: 1/16 at 830 for skin testing 1-55

## 2024-07-26 ENCOUNTER — Encounter: Payer: Self-pay | Admitting: *Deleted

## 2024-07-26 ENCOUNTER — Other Ambulatory Visit: Payer: Self-pay | Admitting: Nurse Practitioner

## 2024-07-26 ENCOUNTER — Other Ambulatory Visit (HOSPITAL_COMMUNITY): Payer: Self-pay

## 2024-07-28 ENCOUNTER — Other Ambulatory Visit: Payer: Self-pay

## 2024-07-28 ENCOUNTER — Other Ambulatory Visit (HOSPITAL_COMMUNITY): Payer: Self-pay

## 2024-07-28 ENCOUNTER — Encounter: Payer: Self-pay | Admitting: Family Medicine

## 2024-07-28 LAB — STREP PNEUMONIAE 23 SEROTYPES IGG
Pneumo Ab Type 1*: 0.4 ug/mL — ABNORMAL LOW
Pneumo Ab Type 12 (12F)*: 0.1 ug/mL — ABNORMAL LOW
Pneumo Ab Type 14*: 0.5 ug/mL — ABNORMAL LOW
Pneumo Ab Type 17 (17F)*: 0.6 ug/mL — ABNORMAL LOW
Pneumo Ab Type 19 (19F)*: 3.7 ug/mL
Pneumo Ab Type 2*: 1.4 ug/mL
Pneumo Ab Type 20*: 2.7 ug/mL
Pneumo Ab Type 22 (22F)*: <0.1 ug/mL — ABNORMAL LOW
Pneumo Ab Type 23 (23F)*: 0.1 ug/mL — ABNORMAL LOW
Pneumo Ab Type 26 (6B)*: 0.3 ug/mL — ABNORMAL LOW
Pneumo Ab Type 3*: 0.1 ug/mL — ABNORMAL LOW
Pneumo Ab Type 34 (10A)*: 1.2 ug/mL — ABNORMAL LOW
Pneumo Ab Type 4*: <0.1 ug/mL — ABNORMAL LOW
Pneumo Ab Type 43 (11A)*: 0.6 ug/mL — ABNORMAL LOW
Pneumo Ab Type 5*: 0.2 ug/mL — ABNORMAL LOW
Pneumo Ab Type 51 (7F)*: 0.4 ug/mL — ABNORMAL LOW
Pneumo Ab Type 54 (15B)*: 0.8 ug/mL — ABNORMAL LOW
Pneumo Ab Type 56 (18C)*: <0.1 ug/mL — ABNORMAL LOW
Pneumo Ab Type 57 (19A)*: 1.7 ug/mL
Pneumo Ab Type 68 (9V)*: <0.1 ug/mL — ABNORMAL LOW
Pneumo Ab Type 70 (33F)*: 0.8 ug/mL — ABNORMAL LOW
Pneumo Ab Type 8*: <0.2 ug/mL — ABNORMAL LOW
Pneumo Ab Type 9 (9N)*: <0.1 ug/mL — ABNORMAL LOW

## 2024-07-28 LAB — IGG, IGA, IGM
IgG (Immunoglobin G), Serum: 1083 mg/dL (ref 586–1602)
IgM (Immunoglobulin M), Srm: 69 mg/dL (ref 26–217)
Immunoglobulin A, (IgA) QN, Serum: 208 mg/dL (ref 87–352)

## 2024-07-28 LAB — DIPHTHERIA / TETANUS ANTIBODY PANEL
Diphtheria Ab: 1.14 [IU]/mL
Tetanus Ab, IgG: 0.42 [IU]/mL

## 2024-07-30 ENCOUNTER — Other Ambulatory Visit (HOSPITAL_COMMUNITY): Payer: Self-pay

## 2024-07-30 ENCOUNTER — Ambulatory Visit: Payer: Self-pay | Admitting: Internal Medicine

## 2024-07-30 ENCOUNTER — Other Ambulatory Visit: Payer: Self-pay

## 2024-07-30 ENCOUNTER — Other Ambulatory Visit: Payer: Self-pay | Admitting: Nurse Practitioner

## 2024-07-30 ENCOUNTER — Encounter: Payer: Self-pay | Admitting: Nurse Practitioner

## 2024-07-30 MED ORDER — METHYLPHENIDATE HCL ER (OSM) 27 MG PO TBCR
27.0000 mg | EXTENDED_RELEASE_TABLET | ORAL | 0 refills | Status: AC
  Filled 2024-07-30 – 2024-08-09 (×2): qty 30, 30d supply, fill #0

## 2024-07-30 NOTE — Progress Notes (Signed)
 Spoke to Pt and informed her lab results per Dr. Tobie:  1) normal immunoglobulins- antibodies that help fight infections 2) normal protein response to vaccine 3) low carbohydrate response to S pneumo vaccine; recommend Pneumovax with repeat tiers in 6-8 weeks.  She verbalized understand. She stated she is out town now and she will call us  back to make an appointment for pneumovax visit when she comes back.

## 2024-08-06 ENCOUNTER — Other Ambulatory Visit (HOSPITAL_COMMUNITY): Payer: Self-pay

## 2024-08-09 ENCOUNTER — Other Ambulatory Visit (HOSPITAL_COMMUNITY): Payer: Self-pay

## 2024-08-09 ENCOUNTER — Other Ambulatory Visit: Payer: Self-pay

## 2024-08-11 ENCOUNTER — Ambulatory Visit: Admitting: Family Medicine

## 2024-08-11 VITALS — BP 121/85 | HR 88 | Temp 98.2°F | Ht 68.0 in | Wt 259.1 lb

## 2024-08-11 DIAGNOSIS — F324 Major depressive disorder, single episode, in partial remission: Secondary | ICD-10-CM

## 2024-08-11 DIAGNOSIS — F411 Generalized anxiety disorder: Secondary | ICD-10-CM | POA: Diagnosis not present

## 2024-08-11 DIAGNOSIS — F988 Other specified behavioral and emotional disorders with onset usually occurring in childhood and adolescence: Secondary | ICD-10-CM

## 2024-08-11 NOTE — Progress Notes (Signed)
 "  Subjective:    Patient ID: Cindy Hamilton, female    DOB: 1985-12-01, 39 y.o.   MRN: [id number]  HPI Patient is in room 9  Patient is here for 4 month follow up  This patient has adult ADD. Takes medication responsibly. Medication does help the patient focus in be more functional. Patient relates that they are or not abusing the medication or misusing the medication. The patient understands that if they're having any negative side effects such as elevated high blood pressure severe headaches they would need stop the medication follow-up immediately. They also understand that the prescriptions are to last for 3 months then the patient will need to follow-up before having further prescriptions.  Patient compliance good compliance  Does medication help patient function /attention better helps her stay more focus  Side effects denies side effects     08/11/2024    4:12 PM 03/26/2024    9:13 AM 02/25/2024    4:41 PM 10/22/2023    4:22 PM 06/18/2023    4:04 PM  Depression screen PHQ 2/9  Decreased Interest 2 1 1  0 0  Down, Depressed, Hopeless 2 0 1 0 0  PHQ - 2 Score 4 1 2  0 0  Altered sleeping 1 0 0 0 0  Tired, decreased energy 1 0 0 1 0  Change in appetite 1 1 0 1 1  Feeling bad or failure about yourself  2 0 0 0 0  Trouble concentrating  1 1 1 1   Moving slowly or fidgety/restless 0 0 0 0 0  Suicidal thoughts 0 0 0 0 0  PHQ-9 Score 9 3  3  3  2    Difficult doing work/chores Somewhat difficult Not difficult at all Somewhat difficult Somewhat difficult Not difficult at all     Data saved with a previous flowsheet row definition      08/11/2024    4:12 PM 03/26/2024    9:13 AM 02/25/2024    4:42 PM 10/22/2023    4:22 PM  GAD 7 : Generalized Anxiety Score  Nervous, Anxious, on Edge 2 0 1 1  Control/stop worrying 2 0 0 0  Worry too much - different things 1 0 0 0  Trouble relaxing 0 0 0 0  Restless 0 0 0 0  Easily annoyed or irritable 1 1 1 1   Afraid - awful might happen 1 0  0 0  Total GAD 7 Score 7 1 2 2   Anxiety Difficulty Somewhat difficult Not difficult at all  Somewhat difficult   She is dealing with mild anxiousness and anxiety partly related to work which is partly how things are  Review of Systems     Objective:   Physical Exam  General-in no acute distress Eyes-no discharge Lungs-respiratory rate normal, CTA CV-no murmurs,RRR Extremities skin warm dry no edema Neuro grossly normal Behavior normal, alert       Assessment & Plan:   1. Attention deficit disorder (ADD) without hyperactivity (Primary) Continue medication 3 prescription sent in Patient can message us  we will send in the fourth Follow-up 4 months  2. Depression, major, single episode, in partial remission Patient recently started back Prozac  10 mg daily I told her if she is not seeing significant improvement within 4 to 6 weeks we are to go up to 20 mg Offered counseling she defers currently She is working her way through this not suicidal  3. GAD (generalized anxiety disorder) Denies any mania symptoms.  But does have some  anxiety symptoms.  Prozac  would help increase dose would help if not seeing improvement within 3 to 6 weeks  Follow-up 4 months "

## 2024-08-13 ENCOUNTER — Ambulatory Visit: Admitting: Internal Medicine

## 2024-08-13 DIAGNOSIS — J3081 Allergic rhinitis due to animal (cat) (dog) hair and dander: Secondary | ICD-10-CM

## 2024-08-13 DIAGNOSIS — J3089 Other allergic rhinitis: Secondary | ICD-10-CM

## 2024-08-13 DIAGNOSIS — J301 Allergic rhinitis due to pollen: Secondary | ICD-10-CM | POA: Diagnosis not present

## 2024-08-13 DIAGNOSIS — J329 Chronic sinusitis, unspecified: Secondary | ICD-10-CM

## 2024-08-13 NOTE — Patient Instructions (Addendum)
 Allergic Rhinitis: - Positive skin test un:umzzd, grasses, weeds, molds, cats - Use nasal saline rinses before nose sprays such as with Neilmed Sinus Rinse.  Use distilled water.   - Use Flonase  2 sprays each nostril daily. Aim upward and outward. - Use Zyrtec 10 mg daily as needed for runny nose, sneezing, itchy watery eyes. - Use Azelastine 2 sprays each nostril twice daily as needed for runny nose, drainage, sneezing, congestion. Aim upward and outward. - Consider allergy  shots as long term control of your symptoms by teaching your immune system to be more tolerant of your allergy  triggers   Mild Intermittent Asthma: - Rescue inhaler: Airsupra  2 puffs every 4-6 hours as needed for respiratory symptoms of cough, shortness of breath, or wheezing Asthma control goals:  Full participation in all desired activities (may need albuterol  before activity) Albuterol  use two times or less a week on average (not counting use with activity) Cough interfering with sleep two times or less a month Oral steroids no more than once a year No hospitalizations    Recurrent Infections - Hx of recurrent respiratory illness.  - Normal Ig, protein response, low S pneumo response.  Plan for Pneumovax with repeat titers in 6-8 weeks.    ALLERGEN AVOIDANCE MEASURES   Molds - Indoor avoidance Use air conditioning to reduce indoor humidity.  Do not use a humidifier. Keep indoor humidity at 30 - 40%.  Use a dehumidifier if needed. In the bathroom use an exhaust fan or open a window after showering.  Wipe down damp surfaces after showering.  Clean bathrooms with a mold-killing solution (diluted bleach, or products like Tilex, etc) at least once a month. In the kitchen use an exhaust fan to remove steam from cooking.  Throw away spoiled foods immediately, and empty garbage daily.  Empty water pans below self-defrosting refrigerators frequently. Vent the clothes dryer to the outside. Limit indoor houseplants; mold  grows in the dirt.  No houseplants in the bedroom. Remove carpet from the bedroom. Encase the mattress and box springs with a zippered encasing.  Molds - Outdoor avoidance Avoid being outside when the grass is being mowed, or the ground is tilled. Avoid playing in leaves, pine straw, hay, etc.  Dead plant materials contain mold. Avoid going into barns or grain storage areas. Remove leaves, clippings and compost from around the home.  Pollen Avoidance Pollen levels are highest during the mid-day and afternoon.  Consider this when planning outdoor activities. Avoid being outside when the grass is being mowed, or wear a mask if the pollen-allergic person must be the one to mow the grass. Keep the windows closed to keep pollen outside of the home. Use an air conditioner to filter the air. Take a shower, wash hair, and change clothing after working or playing outdoors during pollen season. Pet Dander- Cats Keep the pet out of your bedroom and restrict it to only a few rooms. Be advised that keeping the pet in only one room will not limit the allergens to that room. Dont pet, hug or kiss the pet; if you do, wash your hands with soap and water. High-efficiency particulate air (HEPA) cleaners run continuously in a bedroom or living room can reduce allergen levels over time. Regular use of a high-efficiency vacuum cleaner or a central vacuum can reduce allergen levels. Giving your pet a bath at least once a week can reduce airborne allergen.

## 2024-08-13 NOTE — Progress Notes (Signed)
 "  FOLLOW UP Date of Service/Encounter:  08/13/24   Subjective:  Cindy Hamilton (DOB: October 27, 1985) is a 39 y.o. female who [facility] for follow up for skin testing.   History obtained from: chart review and patient.  Anti histamines held.   Past Medical History: Past Medical History:  Diagnosis Date   ADD (attention deficit disorder) 12/19/2021   Diagnosed by psychology   Anemia    Anxiety    Asthma    Depression    GERD (gastroesophageal reflux disease) 12/24/2021   On famotidine  20 mg   Heart murmur    Herpes labialis without complication 12/24/2021   Uses intermittent valacyclovir    Hx of cold sores 12/24/2021   Uses intermittent valacyclovir    Iron  deficiency anemia, unspecified 12/24/2021   Due to heavy cycles.  Takes ferrous sulfate  325 mg daily   Reactive airway disease 12/19/2021   Worse during seasonal allergy  peaks   Recurrent upper respiratory infection (URI)     Objective:  There were no vitals taken for this visit. There is no height or weight on file to calculate BMI. Physical Exam: GEN: alert, well developed HEENT: clear conjunctiva, MMM LUNGS: unlabored respiration  Skin Testing:  Skin prick testing was placed, which includes aeroallergens/foods, histamine control, and saline control.  Verbal consent was obtained prior to placing test.  Patient tolerated procedure well.  Allergy  testing results were read and interpreted by myself, documented by clinical staff. Adequate positive and negative control.  Positive results to:  Results discussed with patient/family.  Airborne Adult Perc - 08/13/24 0845     Time Antigen Placed 0845    Allergen Manufacturer Jestine    Location Back    Number of Test 55    2. Control-Histamine 3+    3. Bahia 3+    4. Bermuda 3+    5. Johnson 3+    6. [place]  Blue 3+    7. Meadow Fescue 3+    8. Perennial Rye 3+    9. Timothy 3+    10. Ragweed Mix Negative    11.  Cocklebur Negative    12. Plantain,  English 3+    13. Baccharis 2+    14. Dog Fennel 3+    15. Russian Thistle 3+    16. Lamb's Quarters 3+    17. Sheep Sorrell 3+    18. Rough Pigweed 3+    19. Marsh Elder, Rough 2+    20. Mugwort, Common 3+    21. Box, Elder 3+    22. Cedar, red Negative    23. Sweet Gum 3+    24. Pecan Pollen 3+    25. Pine Mix Negative    26. Walnut, Black Pollen 3+    27. Red Mulberry 3+    28. Ash Mix 3+    29. Birch Mix 3+    30. Beech American 3+    31. Cottonwood, Eastern 3+    32. Hickory, White 3+    33. Maple Mix 2+    34. Oak, Eastern Mix 3+    35. Sycamore Eastern 3+    36. Alternaria Alternata 3+    37. Cladosporium Herbarum Negative    38. Aspergillus Mix 3+    39. Penicillium Mix Negative    40. Bipolaris Sorokiniana (Helminthosporium) 3+    41. Drechslera Spicifera (Curvularia) Negative    42. Mucor Plumbeus Negative    43. Fusarium Moniliforme Negative    44.  Aureobasidium Pullulans (pullulara) Negative    45. Rhizopus Oryzae Negative    46. Botrytis Cinera Negative    47. Epicoccum Nigrum Negative    48. Phoma Betae Negative    49. Dust Mite Mix Negative    50. Cat Hair 10,000 BAU/ml 3+    51.  Dog Epithelia Negative    52. Mixed Feathers Negative    53. Horse Epithelia Negative    54. Cockroach, German Negative    55. Tobacco Leaf Negative           Assessment:   1. Seasonal allergic rhinitis due to pollen   2. Recurrent sinus infections   3. Allergic rhinitis caused by mold   4. Allergic rhinitis due to animal hair or dander     Plan/Recommendations:   Allergic Rhinitis: - Due to turbinate hypertrophy, seasonal symptoms, recurrent sinus infections and unresponsive to over the counter meds, will perform skin testing to identify aeroallergen triggers.   - Positive skin test un:umzzd, grasses, weeds, molds, cats - Avoidance measures discussed. - Use nasal saline rinses before nose sprays such as with Neilmed Sinus  Rinse.  Use distilled water.   - Use Flonase  2 sprays each nostril daily. Aim upward and outward. - Use Zyrtec 10 mg daily as needed for runny nose, sneezing, itchy watery eyes. - Use Azelastine 2 sprays each nostril twice daily as needed for runny nose, drainage, sneezing, congestion. Aim upward and outward. - Consider allergy  shots as long term control of your symptoms by teaching your immune system to be more tolerant of your allergy  triggers   Mild Intermittent Asthma: - Rescue inhaler: Airsupra  2 puffs every 4-6 hours as needed for respiratory symptoms of cough, shortness of breath, or wheezing Asthma control goals:  Full participation in all desired activities (may need albuterol  before activity) Albuterol  use two times or less a week on average (not counting use with activity) Cough interfering with sleep two times or less a month Oral steroids no more than once a year No hospitalizations    Recurrent Infections - Hx of recurrent respiratory illness.  - Normal Ig, protein response, low S pneumo response.  Plan for Pneumovax with repeat titers in 6-8 weeks.     Return in about 3 months (around 11/11/2024).  Arleta Blanch, MD [facility]       "

## 2024-08-14 LAB — CELIAC DISEASE PANEL
Endomysial IgA: NEGATIVE
Immunoglobulin A, (IgA) QN, Serum: 213 mg/dL (ref 87–352)
t-Transglutaminase (tTG) IgA: 11 U/mL — ABNORMAL HIGH (ref 0–3)

## 2024-08-14 LAB — ENHANCED LIVER FIBROSIS (ELF): ELF(TM) Score: 7.54

## 2024-08-17 ENCOUNTER — Ambulatory Visit

## 2024-08-17 ENCOUNTER — Other Ambulatory Visit: Payer: Self-pay

## 2024-08-17 ENCOUNTER — Other Ambulatory Visit: Payer: Self-pay | Admitting: *Deleted

## 2024-08-17 ENCOUNTER — Encounter: Payer: Self-pay | Admitting: Internal Medicine

## 2024-08-17 ENCOUNTER — Ambulatory Visit: Payer: Self-pay | Admitting: Gastroenterology

## 2024-08-17 ENCOUNTER — Other Ambulatory Visit (HOSPITAL_COMMUNITY): Payer: Self-pay

## 2024-08-17 DIAGNOSIS — D500 Iron deficiency anemia secondary to blood loss (chronic): Secondary | ICD-10-CM

## 2024-08-17 MED ORDER — NA SULFATE-K SULFATE-MG SULF 17.5-3.13-1.6 GM/177ML PO SOLN
ORAL | 0 refills | Status: DC
  Filled 2024-08-17: qty 354, 2d supply, fill #0

## 2024-08-17 NOTE — Telephone Encounter (Signed)
 Mindy/Tammy:  Please arrange colonoscopy and EGD with small bowel biopsies due to IDA, positive celiac serologies. Dr Cindie, ASA 2.   Hold iron  one week prior.  Pregnancy test prior.  Needs to have this done before 2/13 if possible.

## 2024-08-17 NOTE — Addendum Note (Signed)
 Addended by: GAYLENE MADELIN CROME on: 08/17/2024 03:09 PM   Modules accepted: Orders

## 2024-08-18 ENCOUNTER — Other Ambulatory Visit: Payer: Self-pay | Admitting: *Deleted

## 2024-08-18 ENCOUNTER — Other Ambulatory Visit: Payer: Self-pay

## 2024-08-18 MED ORDER — NA SULFATE-K SULFATE-MG SULF 17.5-3.13-1.6 GM/177ML PO SOLN: ORAL | 0 refills | Status: AC

## 2024-08-25 ENCOUNTER — Inpatient Hospital Stay: Attending: Oncology

## 2024-08-25 DIAGNOSIS — N920 Excessive and frequent menstruation with regular cycle: Secondary | ICD-10-CM

## 2024-08-25 MED ORDER — PNEUMOCOCCAL VAC POLYVALENT 25 MCG/0.5ML IJ SOSY
0.5000 mL | PREFILLED_SYRINGE | Freq: Once | INTRAMUSCULAR | Status: AC
  Administered 2024-08-25: 0.5 mL via INTRAMUSCULAR
  Filled 2024-08-25: qty 0.5

## 2024-08-25 NOTE — Progress Notes (Signed)
 Patient tolerated injection with no complaints voiced. Site clean and dry with no bruising or swelling noted at site. See MAR for details. Band aid applied.  Patient stable during and after injection. VSS with discharge and left in satisfactory condition with no s/s of distress noted.

## 2024-08-25 NOTE — Patient Instructions (Signed)
 [facility] [place] - A DEPT OF . [facility]  Discharge Instructions: Thank you for choosing [facility] to provide your oncology and hematology care.  If you have a lab appointment with the Cancer Center - please note that after April 8th, 2024, all labs will be drawn in the cancer center.  You do not have to check in or register with the main entrance as you have in the past but will complete your check-in in the cancer center.  Wear comfortable clothing and clothing appropriate for easy access to any Portacath or PICC line.   We strive to give you quality time with your provider. You may need to reschedule your appointment if you arrive late (15 or more minutes).  Arriving late affects you and other patients whose appointments are after yours.  Also, if you miss three or more appointments without notifying the office, you may be dismissed from the clinic at the providers discretion.      For prescription refill requests, have your pharmacy contact our office and allow 72 hours for refills to be completed.    Today you received the following Pneumococcal vaccine, return as scheduled.   To help prevent nausea and vomiting after your treatment, we encourage you to take your nausea medication as directed.  BELOW ARE SYMPTOMS THAT SHOULD BE REPORTED IMMEDIATELY: *FEVER GREATER THAN 100.4 F (38 C) OR HIGHER *CHILLS OR SWEATING *NAUSEA AND VOMITING THAT IS NOT CONTROLLED WITH YOUR NAUSEA MEDICATION *UNUSUAL SHORTNESS OF BREATH *UNUSUAL BRUISING OR BLEEDING *URINARY PROBLEMS (pain or burning when urinating, or frequent urination) *BOWEL PROBLEMS (unusual diarrhea, constipation, pain near the anus) TENDERNESS IN MOUTH AND THROAT WITH OR WITHOUT PRESENCE OF ULCERS (sore throat, sores in mouth, or a toothache) UNUSUAL RASH, SWELLING OR PAIN  UNUSUAL VAGINAL DISCHARGE OR ITCHING   Items with * indicate a potential emergency and should be followed up as soon as  possible or go to the Emergency Department if any problems should occur.  Please show the CHEMOTHERAPY ALERT CARD or IMMUNOTHERAPY ALERT CARD at check-in to the Emergency Department and triage nurse.  Should you have questions after your visit or need to cancel or reschedule your appointment, please contact [facility] [place] - A DEPT OF [facility] [phone]  and follow the prompts.  Office hours are 8:00 a.m. to 4:30 p.m. Monday - Friday. Please note that voicemails left after 4:00 p.m. may not be returned until the following business day.  We are closed weekends and major holidays. You have access to a nurse at all times for urgent questions. Please call the main number to the clinic [phone] and follow the prompts.  For any non-urgent questions, you may also contact your provider using MyChart. We now offer e-Visits for anyone 35 and older to request care online for non-urgent symptoms. For details visit [email or web address].   Also download the MyChart app! Go to the app store, search MyChart, open the app, select [place], and log in with your MyChart username and password.

## 2024-08-31 ENCOUNTER — Telehealth: Payer: Self-pay | Admitting: *Deleted

## 2024-08-31 ENCOUNTER — Other Ambulatory Visit (HOSPITAL_COMMUNITY)
Admission: RE | Admit: 2024-08-31 | Discharge: 2024-08-31 | Disposition: A | Source: Ambulatory Visit | Attending: Internal Medicine | Admitting: Internal Medicine

## 2024-08-31 DIAGNOSIS — D500 Iron deficiency anemia secondary to blood loss (chronic): Secondary | ICD-10-CM

## 2024-08-31 LAB — PREGNANCY, URINE: Preg Test, Ur: NEGATIVE

## 2024-08-31 NOTE — Telephone Encounter (Signed)
 Patient called in and wants to change procedure over to Dr. Cinderella for 09/03/24. Aware will send new instructions to mychart and will let endo know.

## 2024-09-01 ENCOUNTER — Inpatient Hospital Stay (HOSPITAL_COMMUNITY)
Admission: RE | Admit: 2024-09-01 | Discharge: 2024-09-01 | Disposition: A | Source: Ambulatory Visit | Attending: Gastroenterology

## 2024-09-01 ENCOUNTER — Encounter (HOSPITAL_COMMUNITY): Payer: Self-pay

## 2024-09-01 ENCOUNTER — Other Ambulatory Visit: Payer: Self-pay

## 2024-09-01 VITALS — Ht 68.0 in | Wt 259.1 lb

## 2024-09-01 DIAGNOSIS — Z01818 Encounter for other preprocedural examination: Secondary | ICD-10-CM

## 2024-09-01 HISTORY — DX: Sleep apnea, unspecified: G47.30

## 2024-09-03 ENCOUNTER — Ambulatory Visit (HOSPITAL_COMMUNITY)
Admission: RE | Admit: 2024-09-03 | Discharge: 2024-09-03 | Disposition: A | Source: Home / Self Care | Attending: Gastroenterology | Admitting: Gastroenterology

## 2024-09-03 ENCOUNTER — Ambulatory Visit (HOSPITAL_COMMUNITY): Admission: RE | Admit: 2024-09-03 | Source: Home / Self Care | Admitting: Internal Medicine

## 2024-09-03 ENCOUNTER — Encounter (HOSPITAL_COMMUNITY): Admission: RE | Payer: Self-pay | Source: Home / Self Care

## 2024-09-03 ENCOUNTER — Ambulatory Visit (HOSPITAL_COMMUNITY): Admitting: Anesthesiology

## 2024-09-03 ENCOUNTER — Encounter (HOSPITAL_COMMUNITY): Admission: RE | Disposition: A | Payer: Self-pay | Source: Home / Self Care | Attending: Gastroenterology

## 2024-09-03 DIAGNOSIS — K900 Celiac disease: Secondary | ICD-10-CM

## 2024-09-03 DIAGNOSIS — K640 First degree hemorrhoids: Secondary | ICD-10-CM

## 2024-09-03 MED ORDER — LIDOCAINE HCL (CARDIAC) PF 100 MG/5ML IV SOSY
PREFILLED_SYRINGE | INTRAVENOUS | Status: DC | PRN
  Administered 2024-09-03: 100 mg via INTRAVENOUS

## 2024-09-03 MED ORDER — DEXMEDETOMIDINE HCL IN NACL 80 MCG/20ML IV SOLN
INTRAVENOUS | Status: DC | PRN
  Administered 2024-09-03: 8 ug via INTRAVENOUS

## 2024-09-03 MED ORDER — PROPOFOL 10 MG/ML IV BOLUS
INTRAVENOUS | Status: DC | PRN
  Administered 2024-09-03: 200 ug/kg/min via INTRAVENOUS
  Administered 2024-09-03: 100 mg via INTRAVENOUS

## 2024-09-03 MED ORDER — LACTATED RINGERS IV SOLN: INTRAVENOUS | Status: DC

## 2024-09-03 NOTE — Anesthesia Postprocedure Evaluation (Signed)
"   Anesthesia Post Note  Patient: Cindy Hamilton  Procedure(s) Performed: COLONOSCOPY EGD (ESOPHAGOGASTRODUODENOSCOPY)  Patient location during evaluation: Phase II Anesthesia Type: MAC Level of consciousness: awake Pain management: pain level controlled Vital Signs Assessment: post-procedure vital signs reviewed and stable Respiratory status: spontaneous breathing and respiratory function stable Cardiovascular status: blood pressure returned to baseline and stable Postop Assessment: no headache and no apparent nausea or vomiting Anesthetic complications: no Comments: Late entry   No notable events documented.   Last Vitals:  Vitals:   09/03/24 1133 09/03/24 1259  BP: 113/69 (!) 112/52  Pulse: 62 87  Resp: 14 20  Temp: 36.6 C (!) 36.4 C  SpO2: 100% 100%    Last Pain:  Vitals:   09/03/24 1259  TempSrc: Oral  PainSc: 0-No pain                 Yvonna PARAS Elwin Tsou      "

## 2024-09-03 NOTE — Op Note (Signed)
 [facility] Patient Name: Cindy Hamilton Procedure Date: 09/03/2024 12:14 PM MRN: [id number] Date of Birth: 1986/07/15 Attending MD: Deatrice Dine , MD, [id number] CSN: [id number] Age: 39 Admit Type: Outpatient Procedure:                Colonoscopy Indications:              Iron  deficiency anemia Providers:                Deatrice Dine, MD, Devere Lodge, Daphne Mulch                            Technician, Technician Referring MD:              Medicines:                Monitored Anesthesia Care Complications:            No immediate complications. Estimated Blood Loss:     Estimated blood loss: none. Procedure:                Pre-Anesthesia Assessment:                           - Prior to the procedure, a History and Physical                            was performed, and patient medications and                            allergies were reviewed. The patient's tolerance of                            previous anesthesia was also reviewed. The risks                            and benefits of the procedure and the sedation                            options and risks were discussed with the patient.                            All questions were answered, and informed consent                            was obtained. Prior Anticoagulants: The patient has                            taken no anticoagulant or antiplatelet agents. ASA                            Grade Assessment: II - A patient with mild systemic                            disease. After reviewing the risks and benefits,  the patient was deemed in satisfactory condition to                            undergo the procedure.                           After obtaining informed consent, the colonoscope                            was passed under direct vision. Throughout the                            procedure, the patient's blood pressure, pulse, and                            oxygen saturations were  monitored continuously. The                            [id number]-HQ190L ([id number]) Peds Colon was introduced                            through the anus and advanced to the the terminal                            ileum. The colonoscopy was performed without                            difficulty. The patient tolerated the procedure                            well. The quality of the bowel preparation was                            evaluated using the BBPS [facility] Bowel Preparation                            Scale) with scores of: Right Colon = 3, Transverse                            Colon = 3 and Left Colon = 3 (entire mucosa seen                            well with no residual staining, small fragments of                            stool or opaque liquid). The total BBPS score                            equals 9. The terminal ileum, ileocecal valve,                            appendiceal orifice, and rectum were photographed. Scope In: 12:42:04 PM Scope Out: 12:56:43 PM Scope Withdrawal Time: 0 hours  11 minutes 56 seconds  Total Procedure Duration: 0 hours 14 minutes 39 seconds  Findings:      There is no endoscopic evidence of bleeding or polyps in the entire       colon.      Non-bleeding internal hemorrhoids were found during retroflexion. The       hemorrhoids were small.      The terminal ileum appeared normal. Impression:               - Non-bleeding internal hemorrhoids.                           - The examined portion of the ileum was normal.                           - No specimens collected. Moderate Sedation:      Per Anesthesia Care Recommendation:           - Patient has a contact number available for                            emergencies. The signs and symptoms of potential                            delayed complications were discussed with the                            patient. Return to normal activities tomorrow.                            Written discharge instructions  were provided to the                            patient.                           - Resume previous diet.                           - Continue present medications.                           - Await pathology results.                           - Repeat colonoscopy at age 30 for screening                            purposes.                           - Return to GI office as previously scheduled. Procedure Code(s):        --- Professional ---                           [phone], Colonoscopy, flexible; diagnostic, including  collection of specimen(s) by brushing or washing,                            when performed (separate procedure) Diagnosis Code(s):        --- Professional ---                           K64.8, Other hemorrhoids                           D50.9, Iron  deficiency anemia, unspecified CPT copyright 2022 American Medical Association. All rights reserved. The codes documented in this report are preliminary and upon coder review may  be revised to meet current compliance requirements. Deatrice Dine, MD Deatrice Dine, MD 09/03/2024 1:05:36 PM This report has been signed electronically. Number of Addenda: 0

## 2024-09-03 NOTE — H&P (Signed)
 Primary Care Physician:  Alphonsa Glendia LABOR, MD Primary Gastroenterologist:  Dr. Cinderella  Pre-Procedure History & Physical: HPI: Patient is a  39 y.o. female presenting today with a history of intermittent abdominal pain, burping, RUQ abdominal pain, chronic GERD, here fro evaluation of IDA and positive celiac serologies  FH colon polyps in brother, 2 polyps.    Past Medical History:  Diagnosis Date   ADD (attention deficit disorder) 12/19/2021   Diagnosed by psychology   Anemia    Anxiety    Asthma    Depression    GERD (gastroesophageal reflux disease) 12/24/2021   On famotidine  20 mg   Heart murmur    small PFO   Herpes labialis without complication 12/24/2021   Uses intermittent valacyclovir    Hx of cold sores 12/24/2021   Uses intermittent valacyclovir    Iron  deficiency anemia, unspecified 12/24/2021   Due to heavy cycles.  Takes ferrous sulfate  325 mg daily   Reactive airway disease 12/19/2021   Worse during seasonal allergy  peaks   Recurrent upper respiratory infection (URI)    Sleep apnea     Past Surgical History:  Procedure Laterality Date   CHOLECYSTECTOMY N/A 05/15/2022   Procedure: LAPAROSCOPIC CHOLECYSTECTOMY;  Surgeon: Kallie Manuelita BROCKS, MD;  Location: [facility];  Service: General;  Laterality: N/A;  pt knows to arrive at 7:00   COLONOSCOPY     remote past in [place] .    Prior to Admission medications  Medication Sig Start Date End Date Taking? Authorizing Provider  famotidine  (PEPCID ) 20 MG tablet Take 1 tablet (20 mg total) by mouth 2 (two) times daily. Patient taking differently: Take 20 mg by mouth as directed. As needed at bedtime. 01/03/22  Yes Luking, Glendia LABOR, MD  FLUoxetine  (PROZAC ) 10 MG tablet Take 10 mg by mouth daily.   Yes [provider]  fluticasone  (FLONASE ) 50 MCG/ACT nasal spray Place 2 sprays into both nostrils daily as needed for congestion. 03/26/24  Yes Mauro Elveria BROCKS, NP  methylphenidate  (CONCERTA ) 27 MG PO CR tablet Take 1  tablet (27 mg total) by mouth every morning. 07/30/24  Yes Mauro Elveria BROCKS, NP  Na Sulfate-K Sulfate-Mg Sulfate concentrate (SUPREP) 17.5-3.13-1.6 GM/177ML SOLN Take as directed. 08/18/24  Yes Cindie Dunnings K, DO  valACYclovir  (VALTREX ) 1000 MG tablet Take 2 tablets by mouth at first sign of a cold sore. Repeat in 12 hours. Use as needed as directed 08/14/22  Yes Luking, Glendia LABOR, MD  albuterol  (VENTOLIN  HFA) 108 (90 Base) MCG/ACT inhaler Inhale 2 puffs into the lungs every 6 (six) hours as needed for wheezing or shortness of breath. 01/15/24   Alphonsa Glendia LABOR, MD  Albuterol -Budesonide  (AIRSUPRA ) 90-80 MCG/ACT AERO Inhale 2 puffs into the lungs every 6 (six) hours as needed (wheezing, coughing, shortness of breath). 07/16/24   Tobie Arleta SQUIBB, MD  cholecalciferol (VITAMIN D3) 25 MCG (1000 UNIT) tablet Take 1,000 Units by mouth daily.    [provider]  ferrous sulfate  325 (65 FE) MG EC tablet Take 1 tablet (325 mg total) by mouth daily. 11/03/20   Vigg, Avanti, MD  methylphenidate  (CONCERTA ) 27 MG PO CR tablet Take 1 tablet (27 mg total) by mouth every morning. 02/25/24   Alphonsa Glendia LABOR, MD  methylphenidate  (CONCERTA ) 27 MG PO CR tablet Take 1 tablet (27 mg total) by mouth every morning. 06/22/24   Mauro Elveria BROCKS, NP  methylphenidate  27 MG PO TB24 Take 1 tablet (27 mg total) by mouth in the morning as directed for ADD  01/09/24   Alphonsa Glendia LABOR, MD    Allergies as of 08/31/2024 - Review Complete 08/25/2024  Allergen Reaction Noted   Zinc Dermatitis 05/13/2022    Family History  Problem Relation Age of Onset   Osteopenia Father    Prostate cancer Father    Sleep apnea Father    Colon polyps Brother        unknown if adenomas   Cancer Paternal Aunt        Ovarian   Cancer Maternal Grandmother        Non-Hodgkin's lymphoma   Dementia Maternal Grandfather    Diabetes Maternal Grandfather    Heart disease Maternal Grandfather    Parkinsonism Paternal Grandfather    Prostate cancer  Paternal Grandfather    Colon cancer Neg Hx     Social History   Socioeconomic History   Marital status: Married    Spouse name: ryan   Number of children: 0   Years of education: Not on file   Highest education level: Master's degree (e.g., MA, MS, MEng, MEd, MSW, MBA)  Occupational History   Not on file  Tobacco Use   Smoking status: Never    Passive exposure: Never   Smokeless tobacco: Never  Vaping Use   Vaping status: Never Used  Substance and Sexual Activity   Alcohol use: Never   Drug use: Never   Sexual activity: Yes    Birth control/protection: None  Other Topics Concern   Not on file  Social History Narrative   Not on file   Social Drivers of Health   Tobacco Use: Low Risk (08/25/2024)   Patient History    Smoking Tobacco Use: Never    Smokeless Tobacco Use: Never    Passive Exposure: Never  Financial Resource Strain: Not on file  Food Insecurity: No Food Insecurity (10/31/2023)   Hunger Vital Sign    Worried About Running Out of Food in the Last Year: Never true    Ran Out of Food in the Last Year: Never true  Transportation Needs: No Transportation Needs (10/31/2023)   PRAPARE - Administrator, Civil Service (Medical): No    Lack of Transportation (Non-Medical): No  Physical Activity: Not on file  Stress: Not on file  Social Connections: Not on file  Intimate Partner Violence: Not At Risk (10/31/2023)   Humiliation, Afraid, Rape, and Kick questionnaire    Fear of Current or Ex-Partner: No    Emotionally Abused: No    Physically Abused: No    Sexually Abused: No  Depression (PHQ2-9): Medium Risk (08/11/2024)   Depression (PHQ2-9)    PHQ-2 Score: 9  Alcohol Screen: Not on file  Housing: Low Risk (10/31/2023)   Housing Stability Vital Sign    Unable to Pay for Housing in the Last Year: No    Number of Times Moved in the Last Year: 0    Homeless in the Last Year: No  Utilities: Not At Risk (10/31/2023)   [facility] Utilities    Threatened with loss of  utilities: No  Health Literacy: Not on file    Review of Systems: See HPI, otherwise negative ROS  Physical Exam: Vital signs in last 24 hours: Temp:  [97.9 F (36.6 C)] 97.9 F (36.6 C) (02/06 1133) Pulse Rate:  [62] 62 (02/06 1133) Resp:  [14] 14 (02/06 1133) BP: (113)/(69) 113/69 (02/06 1133) SpO2:  [100 %] 100 % (02/06 1133) Weight:  [117.5 kg] 117.5 kg (02/06 1133)   General:  Alert,  Well-developed, well-nourished, pleasant and cooperative in NAD Head:  Normocephalic and atraumatic. Eyes:  Sclera clear, no icterus.   Conjunctiva pink. Ears:  Normal auditory acuity. Nose:  No deformity, discharge,  or lesions. Msk:  Symmetrical without gross deformities. Normal posture. Extremities:  Without clubbing or edema. Neurologic:  Alert and  oriented x4;  grossly normal neurologically. Skin:  Intact without significant lesions or rashes. Psych:  Alert and cooperative. Normal mood and affect.  Impression/Plan: Ilamae Geng is here for colonoscopy and EGD with small bowel biopsies due to IDA, positive celiac serologies   The risks of the procedure including infection, bleed, or perforation as well as benefits, limitations, alternatives and imponderables have been reviewed with the patient. Questions have been answered. All parties agreeable.

## 2024-09-03 NOTE — Anesthesia Preprocedure Evaluation (Signed)
"                                    Anesthesia Evaluation  Patient identified by MRN, date of birth, ID band Patient awake    Reviewed: Allergy  & Precautions, H&P , NPO status , Patient's Chart, lab work & pertinent test results, reviewed documented beta blocker date and time   Airway Mallampati: II  TM Distance: >3 FB Neck ROM: full    Dental no notable dental hx.    Pulmonary neg pulmonary ROS, asthma , sleep apnea , Recent URI    Pulmonary exam normal breath sounds clear to auscultation       Cardiovascular Exercise Tolerance: Good hypertension, negative cardio ROS + Valvular Problems/Murmurs  Rhythm:regular Rate:Normal     Neuro/Psych  PSYCHIATRIC DISORDERS Anxiety Depression    negative neurological ROS  negative psych ROS   GI/Hepatic negative GI ROS, Neg liver ROS,GERD  ,,  Endo/Other    Class 4 obesity  Renal/GU negative Renal ROS  negative genitourinary   Musculoskeletal   Abdominal   Peds  Hematology negative hematology ROS (+) Blood dyscrasia, anemia   Anesthesia Other Findings   Reproductive/Obstetrics negative OB ROS                              Anesthesia Physical Anesthesia Plan  ASA: 3  Anesthesia Plan: MAC   Post-op Pain Management:    Induction:   PONV Risk Score and Plan: Propofol  infusion  Airway Management Planned:   Additional Equipment:   Intra-op Plan:   Post-operative Plan:   Informed Consent: I have reviewed the patients History and Physical, chart, labs and discussed the procedure including the risks, benefits and alternatives for the proposed anesthesia with the patient or authorized representative who has indicated his/her understanding and acceptance.     Dental Advisory Given  Plan Discussed with: CRNA  Anesthesia Plan Comments:         Anesthesia Quick Evaluation  "

## 2024-09-03 NOTE — Discharge Instructions (Signed)

## 2024-09-03 NOTE — Transfer of Care (Signed)
 Immediate Anesthesia Transfer of Care Note  Patient: Cindy Hamilton  Procedure(s) Performed: COLONOSCOPY EGD (ESOPHAGOGASTRODUODENOSCOPY)  Patient Location: Short Stay  Anesthesia Type:General  Level of Consciousness: awake and alert   Airway & Oxygen Therapy: Patient Spontanous Breathing  Post-op Assessment: Report given to RN and Post -op Vital signs reviewed and stable  Post vital signs: Reviewed and stable  Last Vitals:  Vitals Value Taken Time  BP 112/52 09/03/24 12:59  Temp 36.4 C 09/03/24 12:59  Pulse 87 09/03/24 12:59  Resp 20 09/03/24 12:59  SpO2 100 % 09/03/24 12:59    Last Pain:  Vitals:   09/03/24 1259  TempSrc: Oral  PainSc: 0-No pain      Patients Stated Pain Goal: 7 (09/03/24 1133)  Complications: No notable events documented.

## 2024-09-03 NOTE — Op Note (Signed)
 [facility] Patient Name: Cindy Hamilton Procedure Date: 09/03/2024 12:16 PM MRN: [id number] Date of Birth: January 29, 1986 Attending MD: Deatrice Dine , MD, [id number] CSN: [id number] Age: 39 Admit Type: Outpatient Procedure:                Upper GI endoscopy Indications:              Iron  deficiency anemia, Positive celiac serologies Providers:                Deatrice Dine, MD, Devere Lodge, Daphne Mulch                            Technician, Technician Referring MD:              Medicines:                Monitored Anesthesia Care Complications:            No immediate complications. Estimated Blood Loss:     Estimated blood loss was minimal. Procedure:                Pre-Anesthesia Assessment:                           - Prior to the procedure, a History and Physical                            was performed, and patient medications and                            allergies were reviewed. The patient's tolerance of                            previous anesthesia was also reviewed. The risks                            and benefits of the procedure and the sedation                            options and risks were discussed with the patient.                            All questions were answered, and informed consent                            was obtained. Prior Anticoagulants: The patient has                            taken no anticoagulant or antiplatelet agents. ASA                            Grade Assessment: II - A patient with mild systemic                            disease. After reviewing the risks and benefits,  the patient was deemed in satisfactory condition to                            undergo the procedure.                           After obtaining informed consent, the endoscope was                            passed under direct vision. Throughout the                            procedure, the patient's blood pressure, pulse, and                             oxygen saturations were monitored continuously. The                            [id number] ([id number]) Upper was introduced through                            the mouth, and advanced to the second part of                            duodenum. The upper GI endoscopy was accomplished                            without difficulty. The patient tolerated the                            procedure well. Scope In: 12:29:21 PM Scope Out: 12:37:37 PM Total Procedure Duration: 0 hours 8 minutes 16 seconds  Findings:      The examined esophagus was normal.      Esophagogastric landmarks were identified: the Z-line was found at 39 cm       and the site of hiatal narrowing was found at 41 cm from the incisors.      A 2 cm hiatal hernia was present.      The gastroesophageal flap valve was visualized endoscopically and       classified as Hill Grade II (fold present, opens with respiration).      Mildly erythematous mucosa without bleeding was found in the gastric       body. This was biopsied with a cold forceps for histology.      Diffuse moderately scalloped mucosa was found in the duodenal bulb.       Biopsies for histology were taken with a cold forceps for evaluation of       celiac disease. Impression:               - Normal esophagus.                           - Esophagogastric landmarks identified.                           - 2 cm hiatal hernia.                           -  Gastroesophageal flap valve classified as Hill                            Grade II (fold present, opens with respiration).                           - Erythematous mucosa in the gastric body. Biopsied.                           - Scalloped mucosa was found in the duodenum.                            Biopsied x8. Moderate Sedation:      Per Anesthesia Care Recommendation:           - Patient has a contact number available for                            emergencies. The signs and symptoms of potential                             delayed complications were discussed with the                            patient. Return to normal activities tomorrow.                            Written discharge instructions were provided to the                            patient.                           - Resume previous diet.                           - Continue present medications.                           - Await pathology results.                           - Repeat upper endoscopy for surveillance based on                            pathology results.                           - Return to GI clinic as previously scheduled. Procedure Code(s):        --- Professional ---                           [phone], Esophagogastroduodenoscopy, flexible,                            transoral; with biopsy, single or multiple Diagnosis Code(s):        ---  Professional ---                           K44.9, Diaphragmatic hernia without obstruction or                            gangrene                           K31.89, Other diseases of stomach and duodenum                           D50.9, Iron  deficiency anemia, unspecified                           R76.8, Other specified abnormal immunological                            findings in serum CPT copyright 2022 [employer]. All rights reserved. The codes documented in this report are preliminary and upon coder review may  be revised to meet current compliance requirements. Deatrice Dine, MD Deatrice Dine, MD 09/03/2024 1:02:51 PM This report has been signed electronically. Number of Addenda: 0

## 2024-11-11 ENCOUNTER — Ambulatory Visit: Admitting: Allergy & Immunology

## 2024-12-08 ENCOUNTER — Ambulatory Visit: Admitting: Family Medicine

## 2025-05-19 ENCOUNTER — Ambulatory Visit: Admitting: Neurology
